# Patient Record
Sex: Female | Born: 1970 | Hispanic: Yes | Marital: Married | Smoking: Never smoker
Health system: Southern US, Community
[De-identification: ages and names within clinical notes are randomized; demographics above are authoritative.]

## PROBLEM LIST (undated history)

## (undated) DIAGNOSIS — K219 Gastro-esophageal reflux disease without esophagitis: Secondary | ICD-10-CM

## (undated) HISTORY — PX: TUBAL LIGATION: SHX77

---

## 2014-12-13 ENCOUNTER — Ambulatory Visit (INDEPENDENT_AMBULATORY_CARE_PROVIDER_SITE_OTHER): Payer: Self-pay | Admitting: Physician Assistant

## 2014-12-13 VITALS — BP 122/74 | HR 80 | Temp 98.3°F | Resp 18 | Ht 59.0 in | Wt 109.0 lb

## 2014-12-13 DIAGNOSIS — L500 Allergic urticaria: Secondary | ICD-10-CM

## 2014-12-13 MED ORDER — CETIRIZINE HCL 10 MG PO TABS: 10.0000 mg | ORAL_TABLET | Freq: Every day | ORAL | Status: DC

## 2014-12-13 MED ORDER — HYDROXYZINE HCL 10 MG PO TABS: 10.0000 mg | ORAL_TABLET | Freq: Three times a day (TID) | ORAL | Status: DC | PRN

## 2014-12-13 MED ORDER — RANITIDINE HCL 150 MG PO TABS: 150.0000 mg | ORAL_TABLET | Freq: Two times a day (BID) | ORAL | Status: DC

## 2014-12-13 NOTE — Progress Notes (Signed)
12/13/2014 at 10:47 AM  Adriana Simas / DOB: 1970-03-23 / MRN: [id number]  The patient  does not have a problem list on file.  SUBJECTIVE  Sabriya Yono is a 44 y.o. well appearing female presenting for the chief complaint of rash all over that started three days ago.  Reports large red itchy splotches that come and go, worse after showering and in the morning. Has tried nothing for her symptoms yet.  Denies any new foods and takes no medication and has not tried any new medication.  This is a new problem for her.  Recently moved to [place] from [place] in August.  Denies lip, throat and tongue swelling.     She  has no past medical history on file.    Medications reviewed and updated by myself where necessary, and exist elsewhere in the encounter.   Ms. Lucius Conn has No Known Allergies. She  reports that she has never smoked. She does not have any smokeless tobacco history on file. She reports that she does not drink alcohol or use illicit drugs. She  has no sexual activity history on file. The patient  has no past surgical history on file.  Her family history includes Diabetes in her mother.  Review of Systems  Constitutional: Negative for fever and chills.  Respiratory: Negative for shortness of breath.   Cardiovascular: Negative for leg swelling.  Gastrointestinal: Negative for nausea.  Genitourinary: Negative.   Skin: Positive for itching and rash.  Neurological: Negative for dizziness and headaches.    OBJECTIVE  Her  height is  (1.499 m) and weight is 109 lb (49.442 kg). Her oral temperature is 98.3 F (36.8 C). Her blood pressure is 122/74 and her pulse is 80. Her respiration is 18 and oxygen saturation is 99%.  The patient's body mass index is 22 kg/(m^2).  Physical Exam  Constitutional: She is oriented to person, place, and time. She appears well-developed and well-nourished. No distress.  HENT:  Head:    Mouth/Throat: Uvula is midline. Mucous membranes are not  cyanotic. No oropharyngeal exudate, posterior oropharyngeal edema or posterior oropharyngeal erythema.  Eyes: Pupils are equal, round, and reactive to light.  Cardiovascular: Normal rate.   Respiratory: Effort normal. No respiratory distress.  GI: She exhibits no distension.  Musculoskeletal: Normal range of motion.  Neurological: She is alert and oriented to person, place, and time.  Skin: Skin is warm and dry. Rash noted. Rash is urticarial. She is not diaphoretic.  Mild blanching macular rash of varying size and location.  Worse in the arm pits and popliteal spaces.  Negative for excoriation, vesicles and tenderness.    Psychiatric: She has a normal mood and affect. Her behavior is normal. Judgment and thought content normal.    No results found for this or any previous visit (from the past 24 hour(s)).  ASSESSMENT & PLAN  Yasuko was seen today for rash.  Diagnoses and all orders for this visit:  Allergic urticaria: Mild. Unknown etiology.   No alarm symptoms.  Will treat conservatively and give this more time.  Will consider prednisone and allergist referral as needed by the patient.  She was instructed to call if her symptoms change, worsen, and do not go away with time.  -     cetirizine (ZYRTEC) 10 MG tablet; Take 1 tablet (10 mg total) by mouth daily. -     hydrOXYzine (ATARAX/VISTARIL) 10 MG tablet; Take 1 tablet (10 mg total) by mouth 3 (three) times daily  as needed for itching. -     ranitidine (ZANTAC) 150 MG tablet; Take 1 tablet (150 mg total) by mouth 2 (two) times daily.   The patient was advised to call or come back to clinic if she does not see an improvement in symptoms, or worsens with the above plan.   Deliah Boston, MHS, PA-C [facility] 12/13/2014 10:47 AM

## 2014-12-13 NOTE — Patient Instructions (Addendum)
Take 1 zyrtec daily for the next five days.  Take one ranitidine in the morning and night for the next five days.  Take 1 hydroxyzine every eight hours as needed for itching.   Call me at [phone] if your symptoms worsen so that I may refer you to an allergist and consider other medications. Avoid heavy blankets and clothing, along with hot showers, as this may make your hives worse.

## 2017-11-23 ENCOUNTER — Emergency Department (HOSPITAL_COMMUNITY): Payer: Self-pay

## 2017-11-23 ENCOUNTER — Emergency Department (HOSPITAL_COMMUNITY)
Admission: EM | Admit: 2017-11-23 | Discharge: 2017-11-23 | Disposition: A | Discharge status: Home / Self Care | Payer: Self-pay | Attending: Emergency Medicine | Admitting: Emergency Medicine

## 2017-11-23 ENCOUNTER — Encounter (HOSPITAL_COMMUNITY): Payer: Self-pay

## 2017-11-23 ENCOUNTER — Other Ambulatory Visit: Payer: Self-pay

## 2017-11-23 DIAGNOSIS — Z79899 Other long term (current) drug therapy: Secondary | ICD-10-CM | POA: Insufficient documentation

## 2017-11-23 DIAGNOSIS — K8020 Calculus of gallbladder without cholecystitis without obstruction: Secondary | ICD-10-CM | POA: Insufficient documentation

## 2017-11-23 DIAGNOSIS — R10811 Right upper quadrant abdominal tenderness: Secondary | ICD-10-CM

## 2017-11-23 LAB — URINALYSIS, ROUTINE W REFLEX MICROSCOPIC
BACTERIA UA: NONE SEEN
BILIRUBIN URINE: NEGATIVE
Glucose, UA: NEGATIVE mg/dL
HGB URINE DIPSTICK: NEGATIVE
Ketones, ur: NEGATIVE mg/dL
Leukocytes, UA: NEGATIVE
NITRITE: NEGATIVE
PROTEIN: NEGATIVE mg/dL
SPECIFIC GRAVITY, URINE: 1.014 (ref 1.005–1.030)
pH: 6 (ref 5.0–8.0)

## 2017-11-23 LAB — COMPREHENSIVE METABOLIC PANEL
ALK PHOS: 59 U/L (ref 38–126)
ALT: 17 U/L (ref 0–44)
ANION GAP: 11 (ref 5–15)
AST: 22 U/L (ref 15–41)
Albumin: 3.8 g/dL (ref 3.5–5.0)
BUN: 13 mg/dL (ref 6–20)
CO2: 21 mmol/L — ABNORMAL LOW (ref 22–32)
Calcium: 8.9 mg/dL (ref 8.9–10.3)
Chloride: 106 mmol/L (ref 98–111)
Creatinine, Ser: 0.82 mg/dL (ref 0.44–1.00)
GFR calc non Af Amer: >60 mL/min (ref >60)
GLUCOSE: 129 mg/dL — AB (ref 70–99)
Potassium: 3.2 mmol/L — ABNORMAL LOW (ref 3.5–5.1)
Sodium: 138 mmol/L (ref 135–145)
Total Bilirubin: 0.7 mg/dL (ref 0.3–1.2)
Total Protein: 7.3 g/dL (ref 6.5–8.1)

## 2017-11-23 LAB — CBC
HCT: 39.2 % (ref 36.0–46.0)
HEMOGLOBIN: 13.2 g/dL (ref 12.0–15.0)
MCH: 31.1 pg (ref 26.0–34.0)
MCHC: 33.7 g/dL (ref 30.0–36.0)
MCV: 92.2 fL (ref 78.0–100.0)
Platelets: 231 10*3/uL (ref 150–400)
RBC: 4.25 MIL/uL (ref 3.87–5.11)
RDW: 13.9 % (ref 11.5–15.5)
WBC: 10.8 10*3/uL — ABNORMAL HIGH (ref 4.0–10.5)

## 2017-11-23 LAB — I-STAT BETA HCG BLOOD, ED (MC, WL, AP ONLY): I-stat hCG, quantitative: <5 m[IU]/mL (ref <5)

## 2017-11-23 LAB — LIPASE, BLOOD: Lipase: 37 U/L (ref 11–51)

## 2017-11-23 MED ORDER — ONDANSETRON 4 MG PO TBDP: 4.0000 mg | ORAL_TABLET | Freq: Three times a day (TID) | ORAL | 0 refills | Status: DC | PRN

## 2017-11-23 MED ORDER — ONDANSETRON HCL 4 MG PO TABS
4.0000 mg | ORAL_TABLET | Freq: Once | ORAL | Status: AC
  Administered 2017-11-23: 4 mg via ORAL
  Filled 2017-11-23: qty 1

## 2017-11-23 MED ORDER — SODIUM CHLORIDE 0.9 % IV BOLUS
1000.0000 mL | Freq: Once | INTRAVENOUS | Status: AC
  Administered 2017-11-23: 1000 mL via INTRAVENOUS

## 2017-11-23 MED ORDER — ONDANSETRON HCL 4 MG/2ML IJ SOLN
4.0000 mg | Freq: Once | INTRAMUSCULAR | Status: AC
  Administered 2017-11-23: 4 mg via INTRAVENOUS
  Filled 2017-11-23: qty 2

## 2017-11-23 MED ORDER — DICYCLOMINE HCL 20 MG PO TABS: 20.0000 mg | ORAL_TABLET | Freq: Two times a day (BID) | ORAL | 0 refills | Status: DC | PRN

## 2017-11-23 MED ORDER — KETOROLAC TROMETHAMINE 15 MG/ML IJ SOLN
15.0000 mg | Freq: Once | INTRAMUSCULAR | Status: AC
  Administered 2017-11-23: 15 mg via INTRAVENOUS
  Filled 2017-11-23: qty 1

## 2017-11-23 NOTE — ED Notes (Signed)
Saw PCP for abd pain x5 days ago - was given Toradol, bactrim and omeprazole - states has been taking these meds as directed with no relief; did not take meds this am as she was scheduled for abd US this am at 0800; however, did not have it - presented to ED instead

## 2017-11-23 NOTE — ED Notes (Signed)
Transported to radiology via stretcher per rad tech  

## 2017-11-23 NOTE — ED Triage Notes (Signed)
Pt here for abdominal pain that has gotten worse over the last week, seen PCP and taken medications for such with no improvement. Denies any burning with urination. A&Ox4

## 2017-11-23 NOTE — ED Notes (Signed)
Transported from radiology via stretcher per rad tech  

## 2017-11-23 NOTE — ED Provider Notes (Signed)
[facility] EMERGENCY DEPARTMENT Provider Note   CSN: [id number] Arrival date & time: 11/23/17  0003     History   Chief Complaint Chief Complaint  Patient presents with  . Abdominal Pain  . Back Pain    HPI April Zimmerman is a 47 y.o. female.  HPI   Presents with abdominal pain, began Friday, saw PCP, rx ketorolac and omeprazole, taking it since without improvement. Nagging pain, reports diffuse, radiates to back, reports associated nausea. No vomiting, no diarrhea, no urinary symptoms, no vaginal bleeding.  2 siblings with similar symptoms had cholecystectomies. No etoh/smoking.    No past medical history on file.  There are no active problems to display for this patient.    OB History   None      Home Medications    Prior to Admission medications   Medication Sig Start Date End Date Taking? Authorizing Provider  ketorolac (TORADOL) 10 MG tablet Take 10 mg by mouth every 6 (six) hours as needed for moderate pain.   Yes [provider]  omeprazole (PRILOSEC) 40 MG capsule Take 40 mg by mouth every morning.   Yes [provider]  sulfamethoxazole-trimethoprim (BACTRIM DS,SEPTRA DS) 800-160 MG tablet Take 1 tablet by mouth 2 (two) times daily.   Yes [provider]  dicyclomine (BENTYL) 20 MG tablet Take 1 tablet (20 mg total) by mouth 2 (two) times daily as needed for spasms (abdominal pain). 11/23/17   Alvira Monday, MD  ondansetron (ZOFRAN ODT) 4 MG disintegrating tablet Take 1 tablet (4 mg total) by mouth every 8 (eight) hours as needed for nausea or vomiting. 11/23/17   Alvira Monday, MD    Family History No family history on file.  Social History Social History   Tobacco Use  . Smoking status: Never Smoker  Substance Use Topics  . Alcohol use: Never    Frequency: Never  . Drug use: Never     Allergies   Patient has no known allergies.   Review of Systems Review of Systems  Constitutional:  Negative for fever.  HENT: Negative for sore throat.   Eyes: Negative for visual disturbance.  Respiratory: Negative for cough and shortness of breath.   Cardiovascular: Negative for chest pain.  Gastrointestinal: Positive for abdominal pain and nausea. Negative for constipation, diarrhea and vomiting.  Genitourinary: Negative for difficulty urinating, dysuria, vaginal bleeding and vaginal discharge.  Musculoskeletal: Positive for back pain. Negative for neck pain.  Skin: Negative for rash.  Neurological: Negative for syncope and headaches.     Physical Exam Updated Vital Signs BP 116/68 (BP Location: Right Arm)   Pulse 76   Temp 98.2 F (36.8 C) (Oral)   Resp 20   LMP  (LMP Unknown)   SpO2 100%   Physical Exam  Constitutional: She is oriented to person, place, and time. She appears well-developed and well-nourished. No distress.  HENT:  Head: Normocephalic and atraumatic.  Eyes: Conjunctivae and EOM are normal.  Neck: Normal range of motion.  Cardiovascular: Normal rate, regular rhythm, normal heart sounds and intact distal pulses. Exam reveals no gallop and no friction rub.  No murmur heard. Pulmonary/Chest: Effort normal and breath sounds normal. No respiratory distress. She has no wheezes. She has no rales.  Abdominal: Soft. She exhibits no distension. There is tenderness in the right upper quadrant. There is CVA tenderness (r). There is no guarding, no tenderness at McBurney's point and negative Murphy's sign.  Musculoskeletal: She exhibits no edema or tenderness.  Neurological: She is alert and oriented to person, place, and time.  Skin: Skin is warm and dry. No rash noted. She is not diaphoretic. No erythema.  Nursing note and vitals reviewed.    ED Treatments / Results  Labs (all labs ordered are listed, but only abnormal results are displayed) Labs Reviewed  COMPREHENSIVE METABOLIC PANEL - Abnormal; Notable for the following components:      Result Value    Potassium 3.2 (*)    CO2 21 (*)    Glucose, Bld 129 (*)    All other components within normal limits  CBC - Abnormal; Notable for the following components:   WBC 10.8 (*)    All other components within normal limits  LIPASE, BLOOD  URINALYSIS, ROUTINE W REFLEX MICROSCOPIC  I-STAT BETA HCG BLOOD, ED ([facility], [facility], [facility] ONLY)    EKG None  Radiology Ct Renal Stone Study  Result Date: 11/23/2017 CLINICAL DATA:  Flank pain, worse recently, no history of kidney stones EXAM: CT ABDOMEN AND PELVIS WITHOUT CONTRAST TECHNIQUE: Multidetector CT imaging of the abdomen and pelvis was performed following the standard protocol without IV contrast. COMPARISON:  None. FINDINGS: Lower chest: The lung bases are clear. The heart is within normal limits in size. No pericardial effusion is seen. Hepatobiliary: The liver is unremarkable in the unenhanced state. There is a small gallstone layering in the neck of the gallbladder. Pancreas: The pancreas is normal in size and the pancreatic duct is not dilated. Spleen: The spleen is unremarkable. Adrenals/Urinary Tract: The adrenal glands appear normal. No renal calculi are seen and there is no evidence of hydronephrosis. The ureters appear normal in caliber. The urinary bladder is not well distended but no abnormality is evident. Stomach/Bowel: The stomach is not well distended but no abnormality is evident. No abnormality of small bowel is seen. The colon is unremarkable. The terminal ileum appears normal. The appendix fills with air and is unremarkable. No inflammatory process is seen. Vascular/Lymphatic: The abdominal aorta is normal in caliber. No adenopathy is noted. Reproductive: The uterus is prominent, and fibroids cannot be excluded. No adnexal lesion is seen in no fluid is noted within the pelvis. Other: No abdominal wall hernia is evident. Musculoskeletal: The lumbar vertebrae are in normal alignment with normal intervertebral disc spaces. The SI joints appear  corticated. IMPRESSION: 1. No explanation for the patient's flank pain is seen. No renal or ureteral calculi are noted and there is no evidence of hydronephrosis. 2. The appendix and terminal ileum are unremarkable. 3. The uterus is somewhat prominent but no definite abnormality is noted. No fluid is noted within the pelvis. 4. Single small gallstone layers within the gallbladder. Electronically Signed   By: Dwyane Dee M.D.   On: 11/23/2017 11:05   US Abdomen Limited Ruq  Result Date: 11/23/2017 CLINICAL DATA:  Right upper quadrant pain for 5 days. EXAM: ULTRASOUND ABDOMEN LIMITED RIGHT UPPER QUADRANT COMPARISON:  Body CT 11/23/2017 FINDINGS: Gallbladder: 4 mm nonshadowing hyperechoic structure adherent to the dependent portion of the gallbladder wall. No evidence of gallbladder wall thickening. No sonographic Murphy sign noted by sonographer. Common bile duct: Diameter: 3.6 mm Liver: No focal lesion identified. Within normal limits in parenchymal echogenicity. Portal vein is patent on color Doppler imaging with normal direction of blood flow towards the liver. IMPRESSION: 4 mm nonshadowing gallbladder calculus versus polyp. No evidence of acute cholecystitis. Follow-up in 6-12 months may be considered. Electronically Signed   By: Ted Mcalpine M.D.   On: 11/23/2017  13:37    Procedures Procedures (including critical care time)  Medications Ordered in ED Medications  ondansetron (ZOFRAN) tablet 4 mg (4 mg Oral Given 11/23/17 0015)  ondansetron (ZOFRAN) injection 4 mg (4 mg Intravenous Given 11/23/17 0913)  ketorolac (TORADOL) 15 MG/ML injection 15 mg (15 mg Intravenous Given 11/23/17 0913)  sodium chloride 0.9 % bolus 1,000 mL (0 mLs Intravenous Stopped 11/23/17 1026)     Initial Impression / Assessment and Plan / ED Course  I have reviewed the triage vital signs and the nursing notes.  Pertinent labs & imaging results that were available during my care of the patient were reviewed by me and  considered in my medical decision making (see chart for details).     47yo female presents with concern for abdominal pain.  DDx includes cholelithiasis, pancreatitis, nephrolithiasis.  CT shows GB stone, no other acute abnormalities. RUQ US shows cholelithiasis but no cholecystitis. Pt improved in ED.  Discussed reasons to return in detail. Suspect symptomatic biliary colic and recommend outpatient General Surgery follow up> Given zofran/bentyl. Patient discharged in stable condition with understanding of reasons to return.   Final Clinical Impressions(s) / ED Diagnoses   Final diagnoses:  RUQ abdominal tenderness  Calculus of gallbladder without cholecystitis without obstruction    ED Discharge Orders         Ordered    ondansetron (ZOFRAN ODT) 4 MG disintegrating tablet  Every 8 hours PRN     11/23/17 1346    dicyclomine (BENTYL) 20 MG tablet  2 times daily PRN     11/23/17 1409           Alvira MondaySchlossman, Louie Meaders, MD 11/23/17 2031

## 2017-12-10 ENCOUNTER — Encounter (HOSPITAL_COMMUNITY): Payer: Self-pay | Admitting: *Deleted

## 2017-12-10 ENCOUNTER — Other Ambulatory Visit: Payer: Self-pay

## 2017-12-10 ENCOUNTER — Emergency Department (HOSPITAL_COMMUNITY)
Admission: EM | Admit: 2017-12-10 | Discharge: 2017-12-10 | Disposition: A | Discharge status: Home / Self Care | Payer: Self-pay | Attending: Emergency Medicine | Admitting: Emergency Medicine

## 2017-12-10 DIAGNOSIS — R110 Nausea: Secondary | ICD-10-CM | POA: Insufficient documentation

## 2017-12-10 DIAGNOSIS — K219 Gastro-esophageal reflux disease without esophagitis: Secondary | ICD-10-CM | POA: Insufficient documentation

## 2017-12-10 DIAGNOSIS — R1011 Right upper quadrant pain: Secondary | ICD-10-CM | POA: Insufficient documentation

## 2017-12-10 LAB — URINALYSIS, ROUTINE W REFLEX MICROSCOPIC
BILIRUBIN URINE: NEGATIVE
Glucose, UA: NEGATIVE mg/dL
Hgb urine dipstick: NEGATIVE
Ketones, ur: 20 mg/dL — AB
Leukocytes, UA: NEGATIVE
NITRITE: NEGATIVE
PH: 6 (ref 5.0–8.0)
Protein, ur: NEGATIVE mg/dL
SPECIFIC GRAVITY, URINE: 1.017 (ref 1.005–1.030)

## 2017-12-10 LAB — I-STAT BETA HCG BLOOD, ED (MC, WL, AP ONLY): I-stat hCG, quantitative: <5 m[IU]/mL (ref <5)

## 2017-12-10 LAB — COMPREHENSIVE METABOLIC PANEL
ALBUMIN: 3.9 g/dL (ref 3.5–5.0)
ALK PHOS: 58 U/L (ref 38–126)
ALT: 14 U/L (ref 0–44)
ANION GAP: 9 (ref 5–15)
AST: 18 U/L (ref 15–41)
BUN: 9 mg/dL (ref 6–20)
CO2: 23 mmol/L (ref 22–32)
Calcium: 9.1 mg/dL (ref 8.9–10.3)
Chloride: 107 mmol/L (ref 98–111)
Creatinine, Ser: 0.67 mg/dL (ref 0.44–1.00)
GFR calc Af Amer: >60 mL/min (ref >60)
GFR calc non Af Amer: >60 mL/min (ref >60)
GLUCOSE: 99 mg/dL (ref 70–99)
POTASSIUM: 4 mmol/L (ref 3.5–5.1)
Sodium: 139 mmol/L (ref 135–145)
Total Bilirubin: 0.8 mg/dL (ref 0.3–1.2)
Total Protein: 7 g/dL (ref 6.5–8.1)

## 2017-12-10 LAB — CBC
HEMATOCRIT: 40.3 % (ref 36.0–46.0)
HEMOGLOBIN: 13.2 g/dL (ref 12.0–15.0)
MCH: 30.6 pg (ref 26.0–34.0)
MCHC: 32.8 g/dL (ref 30.0–36.0)
MCV: 93.5 fL (ref 78.0–100.0)
Platelets: 173 10*3/uL (ref 150–400)
RBC: 4.31 MIL/uL (ref 3.87–5.11)
RDW: 13.3 % (ref 11.5–15.5)
WBC: 6.2 10*3/uL (ref 4.0–10.5)

## 2017-12-10 LAB — LIPASE, BLOOD: Lipase: 30 U/L (ref 11–51)

## 2017-12-10 MED ORDER — GI COCKTAIL ~~LOC~~
30.0000 mL | Freq: Once | ORAL | Status: AC
  Administered 2017-12-10: 30 mL via ORAL
  Filled 2017-12-10: qty 30

## 2017-12-10 MED ORDER — PANTOPRAZOLE SODIUM 40 MG PO TBEC
40.0000 mg | DELAYED_RELEASE_TABLET | Freq: Once | ORAL | Status: AC
  Administered 2017-12-10: 40 mg via ORAL
  Filled 2017-12-10: qty 1

## 2017-12-10 MED ORDER — PANTOPRAZOLE SODIUM 40 MG PO TBEC: 40.0000 mg | DELAYED_RELEASE_TABLET | Freq: Every day | ORAL | 1 refills | Status: DC

## 2017-12-10 MED ORDER — PROMETHAZINE HCL 25 MG PO TABS: 25.0000 mg | ORAL_TABLET | Freq: Four times a day (QID) | ORAL | 0 refills | Status: DC | PRN

## 2017-12-10 NOTE — Discharge Instructions (Signed)
You may use over-the-counter Mylanta, Maalox, Zantac, Tums as needed for symptoms of burning.

## 2017-12-10 NOTE — ED Triage Notes (Signed)
The pt is c/o a burning sensation in her abd for 2 weeks  She was seen here then and the pain is no better.  She reports thast she hass not slept for the past 3 nights

## 2017-12-10 NOTE — ED Provider Notes (Signed)
TIME SEEN: 4:39 AM  CHIEF COMPLAINT: Burning abdominal pain  HPI: Patient is a 47 year old female status post BTL who presents to the emergency department with weeks worth of burning abdominal pain.  States pain is in the epigastric region and right upper quadrant without radiation and worse at night and worse when she has an empty stomach.  She has had nausea but no vomiting or diarrhea.  No fever.  No bloody stools or melena.  Was diagnosed with cholelithiasis on CT scan and ultrasound on September 12 in the emergency department.  Followed up with Dr. Derrell Lolling with [facility] surgery who thought this may be more likely GI related and referred her to a gastroenterologist for endoscopy.  Gastroenterologist would not accept her as a new patient as she did not have insurance per her report.  Patient has been using Bentyl and Zofran at home without any relief.  Not on a PPI.  Spanish interpreter used.  ROS: See HPI Constitutional: no fever  Eyes: no drainage  ENT: no runny nose   Cardiovascular:  no chest pain  Resp: no SOB  GI: no vomiting GU: no dysuria Integumentary: no rash  Allergy: no hives  Musculoskeletal: no leg swelling  Neurological: no slurred speech ROS otherwise negative  PAST MEDICAL HISTORY/PAST SURGICAL HISTORY:  History reviewed. No pertinent past medical history.  MEDICATIONS:  Prior to Admission medications   Medication Sig Start Date End Date Taking? Authorizing Provider  cetirizine (ZYRTEC) 10 MG tablet Take 1 tablet (10 mg total) by mouth daily. 12/13/14   Ofilia Neas, PA-C  dicyclomine (BENTYL) 20 MG tablet Take 1 tablet (20 mg total) by mouth 2 (two) times daily as needed for spasms (abdominal pain). 11/23/17   Alvira Monday, MD  hydrOXYzine (ATARAX/VISTARIL) 10 MG tablet Take 1 tablet (10 mg total) by mouth 3 (three) times daily as needed for itching. 12/13/14   Ofilia Neas, PA-C  ketorolac (TORADOL) 10 MG tablet Take 10 mg by mouth every 6 (six)  hours as needed for moderate pain.    [provider]  omeprazole (PRILOSEC) 40 MG capsule Take 40 mg by mouth every morning.    [provider]  ondansetron (ZOFRAN ODT) 4 MG disintegrating tablet Take 1 tablet (4 mg total) by mouth every 8 (eight) hours as needed for nausea or vomiting. 11/23/17   Alvira Monday, MD  ranitidine (ZANTAC) 150 MG tablet Take 1 tablet (150 mg total) by mouth 2 (two) times daily. 12/13/14   Ofilia Neas, PA-C  sulfamethoxazole-trimethoprim (BACTRIM DS,SEPTRA DS) 800-160 MG tablet Take 1 tablet by mouth 2 (two) times daily.    [provider]    ALLERGIES:  No Known Allergies  SOCIAL HISTORY:  Social History   Tobacco Use  . Smoking status: Never Smoker  . Smokeless tobacco: Never Used  Substance Use Topics  . Alcohol use: Never    Frequency: Never    FAMILY HISTORY: Family History  Problem Relation Age of Onset  . Diabetes Mother     EXAM: BP 133/84 (BP Location: Right Arm)   Pulse 77   Temp 98.5 F (36.9 C) (Oral)   Resp 14   Ht 4\' 8"  (1.422 m)   Wt 49.9 kg   LMP  (LMP Unknown)   SpO2 99%   BMI 24.66 kg/m  CONSTITUTIONAL: Alert and oriented and responds appropriately to questions.  Tearful HEAD: Normocephalic EYES: Conjunctivae clear, pupils appear equal, EOMI ENT: normal nose; moist mucous membranes NECK: Supple,  no meningismus, no nuchal rigidity, no LAD  CARD: RRR; S1 and S2 appreciated; no murmurs, no clicks, no rubs, no gallops RESP: Normal chest excursion without splinting or tachypnea; breath sounds clear and equal bilaterally; no wheezes, no rhonchi, no rales, no hypoxia or respiratory distress, speaking full sentences ABD/GI: Normal bowel sounds; non-distended; soft, tender in the epigastric region and right upper quadrant with negative Murphy sign, no rebound, no guarding, no peritoneal signs, no hepatosplenomegaly BACK:  The back appears normal and is non-tender to palpation, there is no CVA  tenderness EXT: Normal ROM in all joints; non-tender to palpation; no edema; normal capillary refill; no cyanosis, no calf tenderness or swelling    SKIN: Normal color for age and race; warm; no rash NEURO: Moves all extremities equally PSYCH: The patient's mood and manner are appropriate. Grooming and personal hygiene are appropriate.  MEDICAL DECISION MAKING: Patient here with symptoms more likely of gastritis versus peptic ulcer versus GERD.  Does have history of cholelithiasis but picture is not consistent with biliary colic.  Labs obtained are unremarkable including no leukocytosis, normal LFTs and lipase.  She is not on a PPI.  Will give GI cocktail, Protonix and reassess.  Patient is upset she would like to be admitted to the hospital for an endoscopy today.  No emergent reason for endoscopy at this time.  She is not bleeding, vomiting, no peritoneal signs.  ED PROGRESS: Patient's pain completely resolved after GI cocktail and PPI.  I recommended she start a PPI regularly and use over-the-counter Mylanta as needed.  Will also discharge with prescription of viscous lidocaine to use as needed.  Recommended diet changes and GI follow-up as an outpatient.   At this time, I do not feel there is any life-threatening condition present. I have reviewed and discussed all results (EKG, imaging, lab, urine as appropriate) and exam findings with patient/family. I have reviewed nursing notes and appropriate previous records.  I feel the patient is safe to be discharged home without further emergent workup and can continue workup as an outpatient as needed. Discussed usual and customary return precautions. Patient/family verbalize understanding and are comfortable with this plan.  Outpatient follow-up has been provided if needed. All questions have been answered.      Orlando Devereux, Layla Maw, DO 12/10/17 [phone]

## 2017-12-12 ENCOUNTER — Ambulatory Visit: Payer: Self-pay | Attending: Family Medicine | Admitting: Family Medicine

## 2017-12-12 ENCOUNTER — Other Ambulatory Visit: Payer: Self-pay

## 2017-12-12 VITALS — BP 115/76 | HR 77 | Temp 98.2°F | Resp 17 | Wt 115.2 lb

## 2017-12-12 DIAGNOSIS — K219 Gastro-esophageal reflux disease without esophagitis: Secondary | ICD-10-CM

## 2017-12-12 DIAGNOSIS — Z79899 Other long term (current) drug therapy: Secondary | ICD-10-CM | POA: Insufficient documentation

## 2017-12-12 DIAGNOSIS — Z09 Encounter for follow-up examination after completed treatment for conditions other than malignant neoplasm: Secondary | ICD-10-CM | POA: Insufficient documentation

## 2017-12-12 DIAGNOSIS — R079 Chest pain, unspecified: Secondary | ICD-10-CM

## 2017-12-12 DIAGNOSIS — Z1239 Encounter for other screening for malignant neoplasm of breast: Secondary | ICD-10-CM

## 2017-12-12 NOTE — Patient Instructions (Addendum)
If you prefer, our new [place] office will open 12/18/2017, you may contact our office on Monday to schedule   Schedule an appointment for a complete physical exam with gynecological visit.  Complete financial assistance paperwork and schedule an appointment with our financial assistance counselor.  Opciones de alimentos para pacientes con reflujo gastroesofgico - Adultos (Food Choices for Gastroesophageal Reflux Disease, Adult) Cuando se tiene reflujo gastroesofgico (ERGE), los alimentos que se ingieren y los hbitos de alimentacin son muy importantes. Elegir los alimentos adecuados puede ayudar a [employer]. QU PAUTAS DEBO SEGUIR?  Elija las frutas, los vegetales, los cereales integrales y los productos lcteos con bajo contenido de [place].  Elija las carnes de [place], de pescado y de ave con bajo contenido de grasas.  Limite las grasas, [address] [place], los aderezos para [place], la [place], los frutos secos y Programme researcher, broadcasting/film/video.  Lleve un registro de alimentos. Esto ayuda a identificar los alimentos que ocasionan sntomas.  Evite los alimentos que le ocasionen sntomas. Pueden ser distintos para cada persona.  Haga comidas pequeas durante Glass blower/designer de 3 comidas abundantes.  Coma lentamente, en un lugar donde est distendido.  Limite el consumo de alimentos fritos.  Cocine los alimentos utilizando mtodos que no sean la fritura.  Evite el consumo alcohol.  Evite beber grandes cantidades de lquidos con las comidas.  Evite agacharse o recostarse hasta despus de 2 o 3horas de haber comido.  QU ALIMENTOS NO SE RECOMIENDAN? Estos son algunos alimentos y bebidas que pueden empeorar los sntomas: Veterinary surgeon. Jugo de tomate. Salsa de tomate y espagueti. Ajes. Cebolla y [place]. Rbano picante. Frutas Naranjas, pomelos y limn (fruta y [place]). Carnes Carnes de [place], de pescado y de ave con gran contenido de grasas. Esto incluye los perros calientes, las  [place], el [place], la salchicha, el salame y el tocino. Lcteos L[place]he entera y [place]. [employer]. Crema. [place]. Helados. Queso crema. Bebidas T o caf. Bebidas gaseosas o bebidas energizantes. Condimentos Salsa picante. Salsa barbacoa. Dulces/postres Chocolate y cacao. [place]. Menta y mentol. Grasas y [employer]. Esto incluye las papas fritas. Otros [place]. Especias picantes. Esto incluye la pimienta negra, la pimienta blanca, la pimienta roja, la pimienta de cayena, el curry en polvo, los clavos de [place], el jengibre y el [place] en polvo. Esta no es [employer] de los alimentos y las bebidas que se Theatre stage manager. Comunquese con el nutricionista para recibir ms informacin. Esta informacin no tiene Theme park manager el consejo del mdico. Asegrese de hacerle al mdico cualquier pregunta que tenga. Document Released: 08/30/2011 Document Revised: 03/21/2014 Document Reviewed: 01/02/2013 Elsevier Interactive Patient Education  2017 [employer].

## 2017-12-12 NOTE — Progress Notes (Signed)
Renetta Suman, is a 47 y.o. female  ZOX:[id number]  [id number]:[id number]  DOB - 1970-04-05  CC:  Chief Complaint  Patient presents with  . Follow-up    originally seen at ED on 9/12. had c/o of RUQ abdominal pain. was referred to GI but couldn't be seen due to lack of insurance. went back to the ED on 12/10/17 & started on Protonix & Phenergan. states that her GERD is better controlled with Protonix.       Chronic health problems include:has Gastroesophageal reflux disease on their problem list.   HPI: Ziare is a 47 y.o. female is here today to establish care. No recent primary care.  Patient has presented to the emergency department twice with abdominal pain.  Initially on 11/23/2017 she was advised that she had gallstones and was referred to general surgery for evaluation.  She represented to the ER on 12/10/2017 and was treated with a PPI and reports today that symptoms have resolved.  She has obtained an appointment with [facility] for second opinion on 12/18/2017.  She reports initially experiencing chest pressure and a burning sensation.  She has associated symptoms with eating high fat and very spicy foods.  Since taking the omeprazole symptoms have resolved completely.  No prior history of work-up for acid reflux.  Patient denies new headaches, chest pain, abdominal pain, nausea, new weakness , numbness or tingling, SOB, edema, or worrisome cough. .   Current medications: Current Outpatient Medications:  .  pantoprazole (PROTONIX) 40 MG tablet, Take 1 tablet (40 mg total) by mouth daily., Disp: 30 tablet, Rfl: 1 .  promethazine (PHENERGAN) 25 MG tablet, Take 1 tablet (25 mg total) by mouth every 6 (six) hours as needed for nausea or vomiting. (Patient not taking: Reported on 12/12/2017), Disp: 15 tablet, Rfl: 0   Pertinent family medical history: family history includes Diabetes in her mother.   No Known Allergies  Social History   Socioeconomic History  . Marital status:  Married    Spouse name: Not on file  . Number of children: Not on file  . Years of education: Not on file  . Highest education level: Not on file  Occupational History  . Not on file  Social Needs  . Financial resource strain: Not on file  . Food insecurity:    Worry: Not on file    Inability: Not on file  . Transportation needs:    Medical: Not on file    Non-medical: Not on file  Tobacco Use  . Smoking status: Never Smoker  . Smokeless tobacco: Never Used  Substance and Sexual Activity  . Alcohol use: Never    Frequency: Never  . Drug use: Never  . Sexual activity: Not on file  Lifestyle  . Physical activity:    Days per week: Not on file    Minutes per session: Not on file  . Stress: Not on file  Relationships  . Social connections:    Talks on phone: Not on file    Gets together: Not on file    Attends religious service: Not on file    Active member of club or organization: Not on file    Attends meetings of clubs or organizations: Not on file    Relationship status: Not on file  . Intimate partner violence:    Fear of current or ex partner: Not on file    Emotionally abused: Not on file    Physically abused: Not on file    Forced  sexual activity: Not on file  Other Topics Concern  . Not on file  Social History Narrative   ** Merged History Encounter **        Review of Systems: Constitutional: Negative for fever, chills, diaphoresis, activity change, appetite change and fatigue. HENT: Negative for ear pain, nosebleeds, congestion, facial swelling, rhinorrhea, neck pain, neck stiffness and ear discharge.  Respiratory: Negative for cough, choking, chest tightness, shortness of breath, wheezing and stridor.  Cardiovascular: Negative for chest pain, palpitations and leg swelling. Gastrointestinal: Negative for abdominal distention. Neurological: Negative for dizziness, tremors, seizures, syncope, facial asymmetry, speech difficulty, weakness, light-headedness,  numbness and headaches.  Hematological: Negative for adenopathy. Does not bruise/bleed easily. Psychiatric/Behavioral: Negative for hallucinations, behavioral problems, confusion, dysphoric mood, decreased concentration and agitation.    Objective:   Vitals:   12/12/17 1058  BP: 115/76  Pulse: 77  Resp: 17  Temp: 98.2 F (36.8 C)  SpO2: 96%    Physical Exam: Constitutional: Patient appears well-developed and well-nourished. No distress. HENT: Normocephalic, atraumatic, External right and left ear normal. Oropharynx is clear and moist.  Eyes: Conjunctivae and EOM are normal. PERRLA, no scleral icterus. Neck: Normal ROM. Neck supple. No JVD. No tracheal deviation. No thyromegaly. CVS: RRR, S1/S2 +, no murmurs, no gallops, no carotid bruit.  Pulmonary: Effort and breath sounds normal, no stridor, rhonchi, wheezes, rales.  Abdominal: Soft. BS +, no distension, tenderness, rebound or guarding.  Musculoskeletal: Normal range of motion. No edema and no tenderness.  Neuro: Alert. Normal reflexes, muscle tone coordination. No cranial nerve deficit. Skin: Skin is warm and dry. No rash noted. Not diaphoretic. No erythema. No pallor. Psychiatric: Normal mood and affect. Behavior, judgment, thought content normal.  Lab Results  Component Value Date   WBC 6.2 12/10/2017   HGB 13.2 12/10/2017   HCT 40.3 12/10/2017   MCV 93.5 12/10/2017   PLT 173 12/10/2017   Lab Results  Component Value Date   CREATININE 0.67 12/10/2017   BUN 9 12/10/2017   NA 139 12/10/2017   K 4.0 12/10/2017   CL 107 12/10/2017   CO2 23 12/10/2017    No results found for: HGBA1C  Lipid Panel  No results found for: CHOL, TRIG, HDL, CHOLHDL, VLDL, LDLCALC     Assessment and plan:  1. Gastroesophageal reflux disease, esophagitis presence not specified, controlled with current therapy. Continue Protonix. No refills on phenergan as patient is negative for nausea.   2. Chest pain, unspecified type, resolve.  Obtained a EKG to rule out cardiac cause of chest pressure and burning.  EKG 12-Lead NSR without ischemic changes.  3. Encounter for screening breast examination - MM Digital Screening; Future   Return in about 2 months (around 02/11/2018) for Complete physical with PAP .  The patient was given clear instructions to go to ER or return to medical center if symptoms don't improve, worsen or new problems develop. The patient verbalized understanding. The patient was told to call to get lab results if they haven't heard anything in the next week.    Godfrey Pick. Tiburcio Pea, MSN, [facility]  [address] [place], [place] [address] [phone]    This note has been created with Dragon speech recognition software and smart phrase technology. Any transcriptional errors are unintentional.

## 2017-12-27 ENCOUNTER — Ambulatory Visit: Payer: Self-pay | Attending: Family Medicine

## 2018-01-08 ENCOUNTER — Other Ambulatory Visit (HOSPITAL_COMMUNITY): Payer: Self-pay | Admitting: *Deleted

## 2018-01-08 DIAGNOSIS — Z1231 Encounter for screening mammogram for malignant neoplasm of breast: Secondary | ICD-10-CM

## 2018-02-02 MED FILL — SUCRALFATE 1 GM TABLET: 1 | 30 days supply | Qty: 60 | Fill #0

## 2018-02-02 MED FILL — DICYCLOMINE HCL 20 MG TABS: 20 | 30 days supply | Qty: 120 | Fill #0

## 2018-02-12 ENCOUNTER — Encounter: Payer: Self-pay | Admitting: Family Medicine

## 2018-02-12 ENCOUNTER — Other Ambulatory Visit (HOSPITAL_COMMUNITY)
Admission: RE | Admit: 2018-02-12 | Discharge: 2018-02-12 | Disposition: A | Discharge status: Home / Self Care | Payer: Self-pay | Source: Ambulatory Visit | Attending: Family Medicine | Admitting: Family Medicine

## 2018-02-12 ENCOUNTER — Ambulatory Visit (INDEPENDENT_AMBULATORY_CARE_PROVIDER_SITE_OTHER): Payer: Self-pay | Admitting: Family Medicine

## 2018-02-12 VITALS — BP 120/81 | HR 79 | Resp 17 | Ht 61.0 in | Wt 113.0 lb

## 2018-02-12 DIAGNOSIS — Z13220 Encounter for screening for lipoid disorders: Secondary | ICD-10-CM

## 2018-02-12 DIAGNOSIS — Z1329 Encounter for screening for other suspected endocrine disorder: Secondary | ICD-10-CM

## 2018-02-12 DIAGNOSIS — Z131 Encounter for screening for diabetes mellitus: Secondary | ICD-10-CM

## 2018-02-12 DIAGNOSIS — Z0000 Encounter for general adult medical examination without abnormal findings: Secondary | ICD-10-CM

## 2018-02-12 DIAGNOSIS — Z124 Encounter for screening for malignant neoplasm of cervix: Secondary | ICD-10-CM | POA: Insufficient documentation

## 2018-02-12 DIAGNOSIS — Z23 Encounter for immunization: Secondary | ICD-10-CM

## 2018-02-12 MED ORDER — TRIAMCINOLONE ACETONIDE 0.1 % EX CREA: 1.0000 "application " | TOPICAL_CREAM | Freq: Two times a day (BID) | CUTANEOUS | 0 refills | Status: DC | PRN

## 2018-02-12 MED FILL — TRIAMCINOLONE ACETONIDE 0.1: 0.1 | 30 days supply | Qty: 90 | Fill #0

## 2018-02-12 NOTE — Progress Notes (Signed)
Established Patient Office Visit  Subjective:  Patient ID: April Zimmerman, female    DOB: 10-28-1970  Age: 47 y.o. MRN: [id number]  CC:  Chief Complaint  Patient presents with  . Annual Exam  . Gynecologic Exam    HPI April Zimmerman presents for complete physical exam. Chronic problems include GERD, currently managed by [facility] physicians. No history of cardiovascular disease, cancer,or or lung disease. She is a non-smoker and non-drinker. She is currently perimenopausal and reports that she continues to have periods however they are steadily decreasing in length.  Current menstrual periods last approximately 1.5 days.  She is a mother of 7 children with the oldest child  age 37 and the youngest 31 years old.  She denies a history of abnormal Pap.  She has a routine mammogram scheduled for April 05, 2018.  No known family history of breast cancer.  She denies any other complaints today. Past Surgical History:  Procedure Laterality Date  . Tubal ligation x 9 years ago       Family History  Problem Relation Age of Onset  . Diabetes Mother     Social History   Socioeconomic History  . Marital status: Married    Spouse name: Not on file  . Number of children: Not on file  . Years of education: Not on file  . Highest education level: Not on file  Occupational History  . Not on file  Social Needs  . Financial resource strain: Not on file  . Food insecurity:    Worry: Not on file    Inability: Not on file  . Transportation needs:    Medical: Not on file    Non-medical: Not on file  Tobacco Use  . Smoking status: Never Smoker  . Smokeless tobacco: Never Used  Substance and Sexual Activity  . Alcohol use: Never    Frequency: Never  . Drug use: Never  . Sexual activity: Not on file  Lifestyle  . Physical activity:    Days per week: Not on file    Minutes per session: Not on file  . Stress: Not on file  Relationships  . Social connections:    Talks on phone: Not on  file    Gets together: Not on file    Attends religious service: Not on file    Active member of club or organization: Not on file    Attends meetings of clubs or organizations: Not on file    Relationship status: Not on file  . Intimate partner violence:    Fear of current or ex partner: Not on file    Emotionally abused: Not on file    Physically abused: Not on file    Forced sexual activity: Not on file  Other Topics Concern  . Not on file  Social History Narrative   ** Merged History Encounter **        Outpatient Medications Prior to Visit  Medication Sig Dispense Refill  . dicyclomine (BENTYL) 20 MG tablet Take 1 tablet by mouth 4 (four) times daily as needed.  1  . sucralfate (CARAFATE) 1 g tablet Take 1 tablet by mouth 2 (two) times daily.  1  . pantoprazole (PROTONIX) 40 MG tablet Take 1 tablet (40 mg total) by mouth daily. 30 tablet 1  . promethazine (PHENERGAN) 25 MG tablet Take 1 tablet (25 mg total) by mouth every 6 (six) hours as needed for nausea or vomiting. (Patient not taking: Reported on 12/12/2017) 15 tablet 0  No facility-administered medications prior to visit.     No Known Allergies  ROS Review of Systems Denies chest pain, headaches, new weakness, worsening cough, abdominal pain, edema, urinary retention, urinary frequency, wheezing,chest tightness,depression, suicidal ideations or auditory hallucinations.   Objective:    Physical Exam  BP 120/81   Pulse 79   Resp 17   Ht 5\' 1"  (1.549 m)   Wt 113 lb (51.3 kg)   LMP 02/03/2018 (Approximate)   SpO2 98%   BMI 21.35 kg/m  Wt Readings from Last 3 Encounters:  02/12/18 113 lb (51.3 kg)  12/12/17 115 lb 3.2 oz (52.3 kg)  12/10/17 110 lb 0.2 oz (49.9 kg)    Constitutional: Patient appears well-developed and well-nourished. No distress. HENT: Normocephalic, atraumatic, External right and left ear normal. Oropharynx is clear and moist.  Eyes: Conjunctivae and EOM are normal. PERRLA, no scleral  icterus. Neck: Normal ROM. Neck supple. No JVD. No tracheal deviation. No thyromegaly. CVS: RRR, S1/S2 +, no murmurs, no gallops, no carotid bruit.  Pulmonary: Effort and breath sounds normal, no stridor, rhonchi, wheezes, rales.  Abdominal: Soft. BS +, no distension, tenderness, rebound or guarding.  Genitourinary:Normal female external genitalia without lesion. No inguinal lymphadenopathy. Vaginal mucosa is pink and moist without lesions. Cervix is closed without discharge, not friable. Pap smear obtained. No cervical motion tenderness, adnexal fullness or tenderness. Musculoskeletal: Normal range of motion. No edema and no tenderness.  Lymphadenopathy: No lymphadenopathy noted, cervical, inguinal or axillary Neuro: Alert. Normal reflexes, muscle tone coordination. No cranial nerve deficit. Skin: Skin is warm and dry. No rash noted. Not diaphoretic. No erythema. No pallor. Psychiatric: Normal mood and affect. Behavior, judgment, thought content normal.  Health Maintenance Due  Topic Date Due  . HIV Screening  03/25/1985  . TETANUS/TDAP  03/25/1989  . PAP SMEAR  03/26/1991    There are no preventive care reminders to display for this patient.  No results found for: TSH Lab Results  Component Value Date   WBC 6.2 12/10/2017   HGB 13.2 12/10/2017   HCT 40.3 12/10/2017   MCV 93.5 12/10/2017   PLT 173 12/10/2017   Lab Results  Component Value Date   NA 139 12/10/2017   K 4.0 12/10/2017   CO2 23 12/10/2017   GLUCOSE 99 12/10/2017   BUN 9 12/10/2017   CREATININE 0.67 12/10/2017   BILITOT 0.8 12/10/2017   ALKPHOS 58 12/10/2017   AST 18 12/10/2017   ALT 14 12/10/2017   PROT 7.0 12/10/2017   ALBUMIN 3.9 12/10/2017   CALCIUM 9.1 12/10/2017   ANIONGAP 9 12/10/2017    Assessment & Plan:   Problem List Items Addressed This Visit    None    Visit Diagnoses    Annual physical exam    -  Primary   Cervical cancer screening         1. Annual physical exam Age-appropriate  anticipatory guidance  - CBC with Differential - EKG 12-Lead, NSR no evidence of ischemia or ST changes   2. Cervical cancer screening - Cytology - PAP(Declo)  3. Screening for diabetes mellitus - Hemoglobin A1c; Future - POCT URINALYSIS DIP (CLINITEK)  4. Screening, lipid - Lipid panel; Future  5. Screening for thyroid disorder - Thyroid Panel With TSH  Meds ordered this encounter  Medications  . triamcinolone cream (KENALOG) 0.1 %    Sig: Apply 1 application topically 2 (two) times daily as needed.    Dispense:  90 g    Refill:  0    Follow-up: as needed.  Joaquin Courts, FNP  Primary Care at [facility] [address] [place] [address] [phone]: [phone]

## 2018-02-13 ENCOUNTER — Other Ambulatory Visit: Payer: Self-pay

## 2018-02-13 DIAGNOSIS — Z131 Encounter for screening for diabetes mellitus: Secondary | ICD-10-CM

## 2018-02-13 DIAGNOSIS — Z0000 Encounter for general adult medical examination without abnormal findings: Secondary | ICD-10-CM

## 2018-02-13 LAB — POCT URINALYSIS DIP (CLINITEK)
BILIRUBIN UA: NEGATIVE
GLUCOSE UA: NEGATIVE mg/dL
Ketones, POC UA: NEGATIVE mg/dL
Leukocytes, UA: NEGATIVE
NITRITE UA: NEGATIVE
POC PROTEIN,UA: NEGATIVE
Spec Grav, UA: >=1.03 — AB (ref 1.010–1.025)
Urobilinogen, UA: 0.2 E.U./dL
pH, UA: 5.5 (ref 5.0–8.0)

## 2018-02-13 NOTE — Addendum Note (Signed)
Addended by: Heidi DachWALKER, Tarisha Fader M on: 02/13/2018 09:01 AM   Modules accepted: Orders

## 2018-02-14 LAB — THYROID PANEL WITH TSH
FREE THYROXINE INDEX: 2.7 (ref 1.2–4.9)
T3 Uptake Ratio: 27 % (ref 24–39)
T4 TOTAL: 9.9 ug/dL (ref 4.5–12.0)
TSH: 1.01 u[IU]/mL (ref 0.450–4.500)

## 2018-02-14 LAB — CBC WITH DIFFERENTIAL/PLATELET
BASOS ABS: 0 10*3/uL (ref 0.0–0.2)
Basos: 0 %
EOS (ABSOLUTE): 0 10*3/uL (ref 0.0–0.4)
Eos: 1 %
HEMATOCRIT: 36.3 % (ref 34.0–46.6)
Hemoglobin: 11.9 g/dL (ref 11.1–15.9)
IMMATURE GRANS (ABS): 0 10*3/uL (ref 0.0–0.1)
Immature Granulocytes: 0 %
LYMPHS ABS: 1.4 10*3/uL (ref 0.7–3.1)
Lymphs: 25 %
MCH: 30.2 pg (ref 26.6–33.0)
MCHC: 32.8 g/dL (ref 31.5–35.7)
MCV: 92 fL (ref 79–97)
MONOCYTES: 7 %
MONOS ABS: 0.4 10*3/uL (ref 0.1–0.9)
NEUTROS ABS: 4 10*3/uL (ref 1.4–7.0)
Neutrophils: 67 %
Platelets: 217 10*3/uL (ref 150–450)
RBC: 3.94 x10E6/uL (ref 3.77–5.28)
RDW: 13.2 % (ref 12.3–15.4)
WBC: 5.9 10*3/uL (ref 3.4–10.8)

## 2018-02-14 LAB — LIPID PANEL
CHOL/HDL RATIO: 3.1 ratio (ref 0.0–4.4)
Cholesterol, Total: 178 mg/dL (ref 100–199)
HDL: 58 mg/dL (ref >39)
LDL Calculated: 103 mg/dL — ABNORMAL HIGH (ref 0–99)
TRIGLYCERIDES: 83 mg/dL (ref 0–149)
VLDL Cholesterol Cal: 17 mg/dL (ref 5–40)

## 2018-02-14 LAB — CYTOLOGY - PAP
Candida vaginitis: NEGATIVE
Chlamydia: NEGATIVE
DIAGNOSIS: NEGATIVE
HPV (WINDOPATH): NOT DETECTED
NEISSERIA GONORRHEA: NEGATIVE
TRICH (WINDOWPATH): NEGATIVE

## 2018-02-14 LAB — HEMOGLOBIN A1C
ESTIMATED AVERAGE GLUCOSE: 120 mg/dL
HEMOGLOBIN A1C: 5.8 % — AB (ref 4.8–5.6)

## 2018-02-16 ENCOUNTER — Encounter: Payer: Self-pay | Admitting: Family Medicine

## 2018-02-23 MED FILL — TRIAMCINOLONE ACETONIDE 0.1: 0.1 | 30 days supply | Qty: 90 | Fill #0

## 2018-02-28 ENCOUNTER — Ambulatory Visit: Payer: Self-pay | Attending: Family Medicine

## 2018-04-05 ENCOUNTER — Encounter (HOSPITAL_COMMUNITY): Payer: Self-pay | Admitting: *Deleted

## 2018-04-05 ENCOUNTER — Ambulatory Visit (HOSPITAL_COMMUNITY)
Admission: RE | Admit: 2018-04-05 | Discharge: 2018-04-05 | Disposition: A | Discharge status: Home / Self Care | Payer: Self-pay | Source: Ambulatory Visit | Attending: Obstetrics and Gynecology | Admitting: Obstetrics and Gynecology

## 2018-04-05 ENCOUNTER — Ambulatory Visit
Admission: RE | Admit: 2018-04-05 | Discharge: 2018-04-05 | Disposition: A | Discharge status: Home / Self Care | Payer: No Typology Code available for payment source | Source: Ambulatory Visit | Attending: Obstetrics and Gynecology | Admitting: Obstetrics and Gynecology

## 2018-04-05 ENCOUNTER — Encounter (HOSPITAL_COMMUNITY): Payer: Self-pay

## 2018-04-05 VITALS — BP 108/70 | Wt 115.0 lb

## 2018-04-05 DIAGNOSIS — Z1239 Encounter for other screening for malignant neoplasm of breast: Secondary | ICD-10-CM

## 2018-04-05 DIAGNOSIS — Z1231 Encounter for screening mammogram for malignant neoplasm of breast: Secondary | ICD-10-CM

## 2018-04-05 HISTORY — DX: Gastro-esophageal reflux disease without esophagitis: K21.9

## 2018-04-05 NOTE — Progress Notes (Signed)
No complaints today.   Pap Smear: Pap smear not completed today. Last Pap smear was 612/04/2017 at Primary Care at [facility] and normal with negative HPV. Per patient has no history of an abnormal Pap smear. Last Pap smear result is in Epic.  Physical exam: Breasts Breasts symmetrical. No skin abnormalities bilateral breasts. No nipple retraction bilateral breasts. No nipple discharge bilateral breasts. No lymphadenopathy. No lumps palpated bilateral breasts. No complaints of pain or tenderness on exam. Referred patient to the [facility] of [facility] for a screening mammogram. Appointment scheduled for Thursday, April 05, 2018 at 1240.       Pelvic/Bimanual No Pap smear completed today since last Pap smear and HPV typing was 02/12/2018. Pap smear not indicated per BCCCP guidelines.   Smoking History: Patient has never smoked.  Patient Navigation: Patient education provided. Access to services provided for patient through [facility] program. Spanish interpreter provided.   Breast and Cervical Cancer Risk Assessment: Patient has no family history of breast cancer, known genetic mutations, or radiation treatment to the chest before age 78. Patient has no history of cervical dysplasia, immunocompromised, or DES exposure in-utero.  Risk Assessment    Risk Scores      04/05/2018   Last edited by: Lynnell Dike, LPN   5-year risk: 0.5 %   Lifetime risk: 4.7 %         Used Spanish interpreter [name] from [place].

## 2018-04-05 NOTE — Patient Instructions (Signed)
Explained breast self awareness with April Zimmerman. Patient did not need a Pap smear today due to last Pap smear and HPV typing was 02/12/2018. Let her know BCCCP will cover Pap smears and HPV typing every 5 years unless has a history of abnormal Pap smears. Referred patient to the [facility] Center of [facility] for a screening mammogram. Appointment scheduled for Thursday, April 05, 2018 at 1240. Patient aware of appointment and will be there. Let patient know the Breast Center will follow up with her within the next couple weeks with results of mammogram by letter or phone. April Zimmerman verbalized understanding.  Srija Southard, Kathaleen Maser, RN 12:31 PM

## 2018-09-13 ENCOUNTER — Telehealth: Payer: Self-pay | Admitting: Family Medicine

## 2018-09-13 NOTE — Telephone Encounter (Signed)
Pt was called since received only the OC application and some documents, she was informed that we are sending the CAFA application also informer her what she need to bring and call for appt

## 2019-02-27 ENCOUNTER — Other Ambulatory Visit (HOSPITAL_COMMUNITY): Payer: Self-pay | Admitting: *Deleted

## 2019-02-27 DIAGNOSIS — Z1231 Encounter for screening mammogram for malignant neoplasm of breast: Secondary | ICD-10-CM

## 2019-04-16 ENCOUNTER — Encounter (HOSPITAL_COMMUNITY): Payer: Self-pay

## 2019-04-16 ENCOUNTER — Other Ambulatory Visit: Payer: Self-pay

## 2019-04-16 ENCOUNTER — Ambulatory Visit
Admission: RE | Admit: 2019-04-16 | Discharge: 2019-04-16 | Disposition: A | Discharge status: Home / Self Care | Payer: No Typology Code available for payment source | Source: Ambulatory Visit | Attending: Obstetrics and Gynecology | Admitting: Obstetrics and Gynecology

## 2019-04-16 ENCOUNTER — Ambulatory Visit (HOSPITAL_COMMUNITY)
Admission: RE | Admit: 2019-04-16 | Discharge: 2019-04-16 | Disposition: A | Discharge status: Home / Self Care | Payer: No Typology Code available for payment source | Source: Ambulatory Visit | Attending: Obstetrics and Gynecology | Admitting: Obstetrics and Gynecology

## 2019-04-16 DIAGNOSIS — Z1231 Encounter for screening mammogram for malignant neoplasm of breast: Secondary | ICD-10-CM

## 2019-04-16 DIAGNOSIS — Z1239 Encounter for other screening for malignant neoplasm of breast: Secondary | ICD-10-CM

## 2019-04-16 NOTE — Patient Instructions (Signed)
Explained breast self awareness with Merelin Medina-Leyva. Patient did not need a Pap smear today due to last Pap smear and HPV typing was 02/12/2018. Let her know BCCCP will cover Pap smears and HPV typing  every 5 years unless has a history of abnormal Pap smears. Referred patient to the [facility] Center of [facility] for a screening mammogram. Appointment scheduled for Tuesday, April 16, 2019 at 1250. Patient aware of appointment and will be there. Let patient know the Breast Center will follow up with her within the next couple weeks with results of mammogram by letter or phone. Melinna Medina-Leyva verbalized understanding.  Ancel Easler, Kathaleen Maser, RN 12:19 PM

## 2019-04-16 NOTE — Progress Notes (Signed)
Complaints of bilateral diffuse breast pain x one week. Patient rates the pain at a 5 out of 10. Patient stated she is due for her menstrual period.  Pap Smear: Pap smear not completed today. Last Pap smear was 02/12/2018 at Primary Care at [facility] and normal with negative HPV. Per patient has no history of an abnormal Pap smear. Last Pap smear result is in Epic.  Physical exam: Breasts Breasts symmetrical. No skin abnormalities bilateral breasts. No nipple retraction bilateral breasts. No nipple discharge bilateral breasts. No lymphadenopathy. No lumps palpated bilateral breasts. Complaints of bilateral diffuse breast tenderness on exam. Referred patient to the [facility] of [facility] for a screening mammogram. Appointment scheduled for Tuesday, April 16, 2019 at 1250.        Pelvic/Bimanual No Pap smear completed today since last Pap smear and HPV typing was 02/12/2018. Pap smear not indicated per BCCCP guidelines.   Smoking History: Patient has never smoked.  Patient Navigation: Patient education provided. Access to services provided for patient through [facility] program. Spanish interpreter provided.   Breast and Cervical Cancer Risk Assessment: Patient has no family history of breast cancer, known genetic mutations, or radiation treatment to the chest before age 21. Patient has no history of cervical dysplasia, immunocompromised, or DES exposure in-utero.  Risk Assessment    Risk Scores      04/16/2019 04/05/2018   Last edited by: Narda Rutherford, LPN Stoney Bang H, LPN   5-year risk: 0.5 % 0.5 %   Lifetime risk: 4.7 % 4.7 %         Used Spanish interpreter [name] from [place].

## 2020-04-16 ENCOUNTER — Other Ambulatory Visit: Payer: Self-pay | Admitting: Obstetrics and Gynecology

## 2020-04-16 DIAGNOSIS — Z1231 Encounter for screening mammogram for malignant neoplasm of breast: Secondary | ICD-10-CM

## 2020-05-28 ENCOUNTER — Other Ambulatory Visit: Payer: Self-pay

## 2020-05-28 ENCOUNTER — Ambulatory Visit: Payer: Self-pay | Admitting: *Deleted

## 2020-05-28 ENCOUNTER — Encounter (INDEPENDENT_AMBULATORY_CARE_PROVIDER_SITE_OTHER): Payer: Self-pay

## 2020-05-28 ENCOUNTER — Ambulatory Visit
Admission: RE | Admit: 2020-05-28 | Discharge: 2020-05-28 | Disposition: A | Discharge status: Home / Self Care | Payer: No Typology Code available for payment source | Source: Ambulatory Visit | Attending: Obstetrics and Gynecology | Admitting: Obstetrics and Gynecology

## 2020-05-28 VITALS — BP 128/80 | Wt 124.9 lb

## 2020-05-28 DIAGNOSIS — Z1231 Encounter for screening mammogram for malignant neoplasm of breast: Secondary | ICD-10-CM

## 2020-05-28 DIAGNOSIS — Z1239 Encounter for other screening for malignant neoplasm of breast: Secondary | ICD-10-CM

## 2020-05-28 NOTE — Patient Instructions (Signed)
Explained breast self awareness with April Zimmerman. Patient did not need a Pap smear today due to last Pap smear and HPV typing was 02/12/2018. Let her know BCCCP will cover Pap smears and HPV typing every 5 years unless has a history of abnormal Pap smears. Referred patient to the [facility] of [facility] for a screening mammogram on mobile unit. Appointment scheduled Thursday, May 28, 2020 at 0930. Patient escorted to the mobile unit following BCCCP appointment for her screening mammogram. Let patient know the Breast Center will follow up with her within the next couple weeks with results of mammogram by letter or phone. April Zimmerman verbalized understanding.  Kentaro Alewine, Kathaleen Maser, RN 8:40 AM

## 2020-05-28 NOTE — Progress Notes (Addendum)
[name] is a 50 y.o. female who presents to [facility] clinic today with complaint of bilateral breast soreness prior to menstrual period.    Pap Smear: Pap smear not completed today. Last Pap smear was 12/2/2019at Primary Care at [facility] normal with negative HPV. Per patient has no history of an abnormal Pap smear. Last Pap smear resultisin Epic.   Physical exam: Breasts Breasts symmetrical. No skin abnormalities bilateral breasts. No nipple retraction bilateral breasts. No nipple discharge bilateral breasts. No lymphadenopathy. No lumps palpated bilateral breasts. No complaints of pain or tenderness on exam.     MS DIGITAL SCREENING TOMO BILATERAL  Result Date: 04/17/2019 CLINICAL DATA:  Screening. EXAM: DIGITAL SCREENING BILATERAL MAMMOGRAM WITH TOMO AND CAD COMPARISON:  Previous exam(s). ACR Breast Density Category d: The breast tissue is extremely dense, which lowers the sensitivity of mammography FINDINGS: There are no findings suspicious for malignancy. Images were processed with CAD. IMPRESSION: No mammographic evidence of malignancy. A result letter of this screening mammogram will be mailed directly to the patient. RECOMMENDATION: Screening mammogram in one year. (Code:SM-B-01Y) BI-RADS CATEGORY  1: Negative. Electronically Signed   By: Sande Brothers M.D.   On: 04/17/2019 08:27   MS DIGITAL SCREENING TOMO BILATERAL  Result Date: 04/06/2018 CLINICAL DATA:  Screening. EXAM: DIGITAL SCREENING BILATERAL MAMMOGRAM WITH TOMO AND CAD COMPARISON:  None. ACR Breast Density Category d: The breast tissue is extremely dense, which lowers the sensitivity of mammography. FINDINGS: There are no findings suspicious for malignancy. Images were processed with CAD. IMPRESSION: No mammographic evidence of malignancy. A result letter of this screening mammogram will be mailed directly to the patient. RECOMMENDATION: Screening mammogram in one year. (Code:SM-B-01Y) BI-RADS CATEGORY  1:  Negative. Electronically Signed   By: Baird Lyons M.D.   On: 04/06/2018 09:54   Pelvic/Bimanual Pap is not indicated today per BCCCP guidelines.   Smoking History: Patient has never smoked.   Patient Navigation: Patient education provided. Access to services provided for patient through [facility] [facility] program. Spanish interpreter [name] from [facility] provided.   Colorectal Cancer Screening: Per patient has never had colonoscopy completed. No complaints today.    Breast and Cervical Cancer Risk Assessment: Patient does not have family history of breast cancer, known genetic mutations, or radiation treatment to the chest before age 20. Patient does not have history of cervical dysplasia, immunocompromised, or DES exposure in-utero.  Risk Assessment    Risk Scores      05/28/2020 04/16/2019   Last edited by: Narda Rutherford, LPN McGill, Sherie Demetrius Charity, LPN   5-year risk: 0.5 % 0.5 %   Lifetime risk: 4.6 % 4.7 %          A: BCCCP exam without pap smear Complaint of bilateral breast soreness prior to menstrual period.  P: Referred patient to the Breast Center of [facility] for a screening mammogram on mobile unit. Appointment scheduled Thursday, May 28, 2020 at 0930.  Priscille Heidelberg, RN 05/28/2020 8:40 AM

## 2020-06-02 ENCOUNTER — Other Ambulatory Visit: Payer: Self-pay | Admitting: Obstetrics and Gynecology

## 2020-06-02 DIAGNOSIS — R928 Other abnormal and inconclusive findings on diagnostic imaging of breast: Secondary | ICD-10-CM

## 2020-06-18 ENCOUNTER — Other Ambulatory Visit: Payer: Self-pay | Admitting: Obstetrics and Gynecology

## 2020-06-18 ENCOUNTER — Ambulatory Visit
Admission: RE | Admit: 2020-06-18 | Discharge: 2020-06-18 | Disposition: A | Discharge status: Home / Self Care | Payer: No Typology Code available for payment source | Source: Ambulatory Visit | Attending: Obstetrics and Gynecology | Admitting: Obstetrics and Gynecology

## 2020-06-18 ENCOUNTER — Other Ambulatory Visit: Payer: Self-pay

## 2020-06-18 ENCOUNTER — Ambulatory Visit: Payer: No Typology Code available for payment source

## 2020-06-18 ENCOUNTER — Other Ambulatory Visit: Payer: No Typology Code available for payment source

## 2020-06-18 DIAGNOSIS — N6320 Unspecified lump in the left breast, unspecified quadrant: Secondary | ICD-10-CM

## 2020-06-18 DIAGNOSIS — R928 Other abnormal and inconclusive findings on diagnostic imaging of breast: Secondary | ICD-10-CM

## 2020-06-23 ENCOUNTER — Ambulatory Visit
Admission: RE | Admit: 2020-06-23 | Discharge: 2020-06-23 | Disposition: A | Discharge status: Home / Self Care | Payer: No Typology Code available for payment source | Source: Ambulatory Visit | Attending: Obstetrics and Gynecology | Admitting: Obstetrics and Gynecology

## 2020-06-23 ENCOUNTER — Other Ambulatory Visit: Payer: Self-pay

## 2020-06-23 DIAGNOSIS — N6320 Unspecified lump in the left breast, unspecified quadrant: Secondary | ICD-10-CM

## 2020-06-23 HISTORY — PX: BREAST BIOPSY: SHX20

## 2020-06-25 ENCOUNTER — Encounter: Payer: Self-pay | Admitting: Obstetrics and Gynecology

## 2021-05-03 ENCOUNTER — Other Ambulatory Visit: Payer: Self-pay

## 2021-05-03 DIAGNOSIS — Z1231 Encounter for screening mammogram for malignant neoplasm of breast: Secondary | ICD-10-CM

## 2021-06-15 ENCOUNTER — Ambulatory Visit
Admission: RE | Admit: 2021-06-15 | Discharge: 2021-06-15 | Disposition: A | Discharge status: Home / Self Care | Payer: No Typology Code available for payment source | Source: Ambulatory Visit | Attending: Obstetrics and Gynecology | Admitting: Obstetrics and Gynecology

## 2021-06-15 ENCOUNTER — Ambulatory Visit: Payer: Self-pay | Admitting: *Deleted

## 2021-06-15 VITALS — BP 120/72 | Wt 125.4 lb

## 2021-06-15 DIAGNOSIS — Z1239 Encounter for other screening for malignant neoplasm of breast: Secondary | ICD-10-CM

## 2021-06-15 DIAGNOSIS — Z1211 Encounter for screening for malignant neoplasm of colon: Secondary | ICD-10-CM

## 2021-06-15 DIAGNOSIS — Z1231 Encounter for screening mammogram for malignant neoplasm of breast: Secondary | ICD-10-CM

## 2021-06-15 NOTE — Patient Instructions (Signed)
Explained breast self awareness with [facility] Adolphus [place]chwood. Patient did not need a Pap smear today due to last Pap smear and HPV typing was 02/12/2018. Let her know BCCCP will cover Pap smears and HPV typing every 5 years unless has a history of abnormal Pap smears. Referred patient to the Breast Center of [facility] for a screening mammogram on mobile unit. Appointment scheduled Tuesday, June 15, 2021 at 1020. Patient aware of appointment and will be there. Let patient know the Breast Center will follow up with her within the next couple weeks with results of her mammogram by letter or phone. Jariyah Estela Adolphus Birchwood verbalized understanding. ? ?Ismar Yabut, Kathaleen Maser, RN ?9:18 AM ? ? ? ? ?

## 2021-06-15 NOTE — Progress Notes (Signed)
[name] is a 51 y.o. female who presents to [facility] clinic today with no complaints.  ?  ?Pap Smear: Pap smear not completed today. Last Pap smear was 02/12/2018 at [facility] and normal with negative HPV. Per patient has no history of an abnormal Pap smear. Last Pap smear result is available in Epic. ?  ?Physical exam: ?Breasts ?Breasts symmetrical. No skin abnormalities bilateral breasts. No nipple retraction bilateral breasts. No nipple discharge bilateral breasts. No lymphadenopathy. No lumps palpated bilateral breasts. No complaints of pain or tenderness. ? ?MS DIGITAL SCREENING TOMO BILATERAL ? ?Result Date: 05/29/2020 ?CLINICAL DATA:  Screening. EXAM: DIGITAL SCREENING BILATERAL MAMMOGRAM WITH TOMOSYNTHESIS AND CAD TECHNIQUE: Bilateral screening digital craniocaudal and mediolateral oblique mammograms were obtained. Bilateral screening digital breast tomosynthesis was performed. The images were evaluated with computer-aided detection. COMPARISON:  Previous exam(s). ACR Breast Density Category d: The breast tissue is extremely dense, which lowers the sensitivity of mammography. FINDINGS: In the right breast a possible distortion requires further evaluation. In the left breast a possible mass requires further evaluation. IMPRESSION: Further evaluation is suggested for possible distortion in the right breast. Further evaluation is suggested for a possible mass in the left breast. RECOMMENDATION: Diagnostic mammogram and possibly ultrasound of both breasts. (Code:FI-B-84M) The patient will be contacted regarding the findings, and additional imaging will be scheduled. BI-RADS CATEGORY  0: Incomplete. Need additional imaging evaluation and/or prior mammograms for comparison. Electronically Signed   By: Frederico Hamman M.D.   On: 05/29/2020 11:52  ? ?MS DIGITAL SCREENING TOMO BILATERAL ? ?Result Date: 04/17/2019 ?CLINICAL DATA:  Screening. EXAM: DIGITAL SCREENING BILATERAL  MAMMOGRAM WITH TOMO AND CAD COMPARISON:  Previous exam(s). ACR Breast Density Category d: The breast tissue is extremely dense, which lowers the sensitivity of mammography FINDINGS: There are no findings suspicious for malignancy. Images were processed with CAD. IMPRESSION: No mammographic evidence of malignancy. A result letter of this screening mammogram will be mailed directly to the patient. RECOMMENDATION: Screening mammogram in one year. (Code:SM-B-01Y) BI-RADS CATEGORY  1: Negative. Electronically Signed   By: Sande Brothers M.D.   On: 04/17/2019 08:27  ? ?MS DIGITAL SCREENING TOMO BILATERAL ? ?Result Date: 04/06/2018 ?CLINICAL DATA:  Screening. EXAM: DIGITAL SCREENING BILATERAL MAMMOGRAM WITH TOMO AND CAD COMPARISON:  None. ACR Breast Density Category d: The breast tissue is extremely dense, which lowers the sensitivity of mammography. FINDINGS: There are no findings suspicious for malignancy. Images were processed with CAD. IMPRESSION: No mammographic evidence of malignancy. A result letter of this screening mammogram will be mailed directly to the patient. RECOMMENDATION: Screening mammogram in one year. (Code:SM-B-01Y) BI-RADS CATEGORY  1: Negative. Electronically Signed   By: Baird Lyons M.D.   On: 04/06/2018 09:54  ? ?MS DIGITAL DIAG TOMO BILAT ? ?Result Date: 06/18/2020 ?CLINICAL DATA:  51 year old female for further evaluation of possible RIGHT breast distortion and possible LEFT breast mass on screening mammogram. EXAM: DIGITAL DIAGNOSTIC BILATERAL MAMMOGRAM WITH TOMOSYNTHESIS AND CAD; ULTRASOUND LEFT BREAST LIMITED TECHNIQUE: Bilateral digital diagnostic mammography and breast tomosynthesis was performed. The images were evaluated with computer-aided detection.; Targeted ultrasound examination of the left breast was performed COMPARISON:  Previous exam(s). ACR Breast Density Category c: The breast tissue is heterogeneously dense, which may obscure small masses. FINDINGS: 2D/3D full field and spot  compression views of both breasts demonstrate no persistent distortion or suspicious abnormality at the site of the RIGHT breast screening study finding. A persistent circumscribed oval mass within the UPPER  INNER LEFT breast is identified. Targeted ultrasound is performed, showing a 1.7 x 0.7 x 1.4 cm circumscribed oval hypoechoic parallel mass at the 10 o'clock position of the LEFT breast 5 cm from the nipple. No abnormal LEFT axillary lymph nodes are identified. IMPRESSION: 1. 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 We discussed management options including excision, ultrasound-guided core biopsy, and close follow-up. The patient desires tissue sampling. 2. No persistent distortion or suspicious abnormality at the site of the RIGHT breast screening study finding. RECOMMENDATION: Ultrasound-guided LEFT breast biopsy, which will be scheduled. I have discussed the findings and recommendations with the patient. If applicable, a reminder letter will be sent to the patient regarding the next appointment. BI-RADS CATEGORY  3: Probably benign. Electronically Signed   By: Harmon Pier M.D.   On: 06/18/2020 16:08  ? ?MM CLIP PLACEMENT LEFT ? ?Result Date: 06/23/2020 ?CLINICAL DATA:  51 year old female status post ultrasound biopsy of the left breast. EXAM: DIAGNOSTIC LEFT MAMMOGRAM POST ULTRASOUND BIOPSY COMPARISON:  Previous exam(s). FINDINGS: Mammographic images were obtained following ultrasound guided biopsy of the left breast. The biopsy marking clip is in expected position at the site of biopsy. IMPRESSION: Appropriate positioning of the ribbon shaped biopsy marking clip at the site of biopsy in the upper inner left breast. Final Assessment: Post Procedure Mammograms for Marker Placement Electronically Signed   By: Sande Brothers M.D.   On: 06/23/2020 13:34   ?     ?Pelvic/Bimanual ?Pap is not indicated today per BCCCP ?  ?Smoking History: ?Patient has never smoked. ?  ?Patient  Navigation: ?Patient education provided. Access to services provided for patient through [place] program. Spanish interpreter [name] from [facility] provided.  ? ?Colorectal Cancer Screening: ?Per patient has never had colonoscopy completed. FIT Test given to patient to complete. No complaints today.  ?  ?Breast and Cervical Cancer Risk Assessment: ?Patient does not have family history of breast cancer, known genetic mutations, or radiation treatment to the chest before age 44. Patient does not have history of cervical dysplasia, immunocompromised, or DES exposure in-utero. ? ?Risk Assessment   ? ? Risk Scores   ? ?   06/15/2021 05/28/2020  ? Last edited by: Narda Rutherford, LPN [place], Fidel Levy, LPN  ? 5-year risk: 0.6 % 0.5 %  ? Lifetime risk: 5.3 % 4.6 %  ? ?  ?  ? ?  ? ? ?A: ?BCCCP exam without pap smear ?No complaints. ? ?P: ?Referred patient to the [facility] for a screening mammogram on mobile unit. Appointment scheduled Tuesday, June 15, 2021 at 1020. ?  ? ?Priscille Heidelberg, RN ?06/15/2021 9:18 AM   ?

## 2021-07-08 LAB — FECAL OCCULT BLOOD, IMMUNOCHEMICAL: Fecal Occult Bld: NEGATIVE

## 2021-07-09 ENCOUNTER — Telehealth: Payer: Self-pay

## 2021-07-09 NOTE — Telephone Encounter (Signed)
Via Julie Sowell, Spanish Interpreter ([place]), Patient informed negative FIT test results. Patient verbalized understanding.  

## 2021-07-29 ENCOUNTER — Other Ambulatory Visit: Payer: Self-pay

## 2021-07-29 ENCOUNTER — Inpatient Hospital Stay: Payer: Self-pay | Attending: Obstetrics and Gynecology | Admitting: *Deleted

## 2021-07-29 VITALS — BP 114/80 | Ht <= 58 in | Wt 122.5 lb

## 2021-07-29 DIAGNOSIS — Z0000 Encounter for general adult medical examination without abnormal findings: Secondary | ICD-10-CM

## 2021-07-29 NOTE — Progress Notes (Signed)
[facility] initial screening   Interpreter- [name], [employer]   Clinical Measurement: There were no vitals filed for this visit. Fasting Labs Drawn Today, will review with patient when they result.   Medical History:  Patient states that she  doesn't know if she has  high cholesterol, does not have high blood pressure and she  doesn't know if she has  diabetes.  Medications:  Patient states that she does not take medication to lower cholesterol, blood pressure or blood sugar.  Patient does not take an aspirin a day to help prevent a heart attack or stroke.    Blood pressure, self measurement: Patient states that she does not measure blood pressure from home. She checks her blood pressure N/A. She shares her readings with a health care provider: N/A.   Nutrition: Patient states that on average she eats 1 cups of fruit and 2 cups of vegetables per day. Patient states that she does not eat fish at least 2 times per week. Patient eats more than half servings of whole grains. Patient drinks less than 36 ounces of beverages with added sugar weekly: yes. Patient is currently watching sodium or salt intake: yes. In the past 7 days patient has consumed drinks containing alcohol on 0 days. On a day that patient consumes drinks containing alcohol on average 0 drinks are consumed.      Physical activity:  Patient states that she gets 45 minutes of moderate and 45 minutes of vigorous physical activity each week.  Smoking status:  Patient states that she has has never smoked .   Quality of life:  Over the past 2 weeks patient states that she had little interest or pleasure in doing things: several days. She has been feeling down, depressed or hopeless:several days.    Risk reduction and counseling:    Health Coaching: Spoke with patient about the daily recommendation for fruits and vegetables. Showed patient what a serving size would look like. Patient consumes a variety of whole grains including:  oatmeal, whole grain bread and brown rice. Patient has been walking or running 3 days a week for 30 minutes.   Goal: Patient would like to exercise more. Patient would like to get a gym membership so that she can exercise more often. Patient would like to exercise 3-4 days a week for at least 30 minutes a day.    Navigation:  I will notify patient of lab results.  Patient is aware of 2 more health coaching sessions and a follow up.  Time: 20 minutes

## 2021-07-30 LAB — HEMOGLOBIN A1C
Est. average glucose Bld gHb Est-mCnc: 123 mg/dL
Hgb A1c MFr Bld: 5.9 % — ABNORMAL HIGH (ref 4.8–5.6)

## 2021-07-30 LAB — LIPID PANEL
Chol/HDL Ratio: 3.8 ratio (ref 0.0–4.4)
Cholesterol, Total: 223 mg/dL — ABNORMAL HIGH (ref 100–199)
HDL: 59 mg/dL (ref >39)
LDL Chol Calc (NIH): 149 mg/dL — ABNORMAL HIGH (ref 0–99)
Triglycerides: 86 mg/dL (ref 0–149)
VLDL Cholesterol Cal: 15 mg/dL (ref 5–40)

## 2021-07-30 LAB — GLUCOSE, RANDOM: Glucose: 98 mg/dL (ref 70–99)

## 2021-08-02 ENCOUNTER — Telehealth: Payer: Self-pay

## 2021-08-02 NOTE — Telephone Encounter (Signed)
Health coaching 2   interpreter- [name], [employer]   Labs- 223 cholesterol, 149 LDL cholesterol, 86 triglycerides, 59 HDL cholesterol, 5.9 hemoglobin A1C, 98 mean plasma glucose  Patient understands and is aware of her lab results.   Goals-  1. Reduce the amount of fried and fatty foods consumed. 2. Reduce the amount of sugars consumed in both food and drinks. 3. Reduce the amount of carbs consumed. 4. Exercise daily for 20-30 minutes.   Navigation:  Patient is aware of 1 more health coaching sessions and a follow up. Referred patient to PCP at Primary Care @ [facility] for FU for elevated labs. Will call patient back with appointment information once scheduled. Referred patient to the [facility] [place] [facility].   Time-  15 minutes

## 2021-08-10 ENCOUNTER — Ambulatory Visit: Payer: No Typology Code available for payment source | Admitting: Physician Assistant

## 2021-08-11 ENCOUNTER — Encounter: Payer: Self-pay | Admitting: Physician Assistant

## 2021-08-11 ENCOUNTER — Ambulatory Visit (INDEPENDENT_AMBULATORY_CARE_PROVIDER_SITE_OTHER): Payer: Self-pay | Admitting: Physician Assistant

## 2021-08-11 VITALS — BP 120/74 | HR 80 | Temp 98.9°F | Resp 18 | Ht 60.0 in | Wt 123.0 lb

## 2021-08-11 DIAGNOSIS — Z114 Encounter for screening for human immunodeficiency virus [HIV]: Secondary | ICD-10-CM

## 2021-08-11 DIAGNOSIS — E782 Mixed hyperlipidemia: Secondary | ICD-10-CM

## 2021-08-11 DIAGNOSIS — R7303 Prediabetes: Secondary | ICD-10-CM

## 2021-08-11 DIAGNOSIS — Z6824 Body mass index (BMI) 24.0-24.9, adult: Secondary | ICD-10-CM

## 2021-08-11 NOTE — Progress Notes (Signed)
Patient has eaten today. ?Patient has not taken medication today. ?Patient denies pain at this time. ?

## 2021-08-11 NOTE — Patient Instructions (Signed)
I encourage you to follow a low sugar, low cholesterol diet.  A good one to follow is Mediterranean style eating   I encourage 20-30 minutes of cardiovascular exercise 3-4 times a week as well.  I also encourage you to take Omega-3 fish oil daily.  You can purchase these over the counter.  Kennieth Rad, PA-C Physician Assistant [facility] [email or web address]  Dieta mediterrnea [place] Diet El trmino "dieta mediterrnea" hace referencia a elecciones de alimentacin y de estilo de vida basadas en las tradiciones de los pases ubicados en las costas del mar Mediterrneo. Se centra en comer ms frutas, verduras, cereales integrales, legumbres, frutos secos, semillas y grasas cardiosaludables, y comer menos productos lcteos, carne, huevos y alimentos procesados con azcar agregada, sal y Wendee Copp. Se ha demostrado que esta dieta ayuda a prevenir determinadas afecciones y a Coca-Cola que padecen enfermedades crnicas, como enfermedades renales y cardacas. Consejos para seguir Photographer las etiquetas de los alimentos Verificar el tamao de la porcin de los alimentos envasados. En el caso del arroz y la pasta, el tamao de la porcin se refiere a la cantidad de alimento cocido, no seco. Controlar la cantidad total de grasa de los alimentos envasados. Evite los que contienen grasas saturadas o trans. Lea la lista de ingredientes para determinar si los alimentos contienen azcares agregados, como jarabe de maz. Al ir de compras  Compre una variedad de alimentos que ofrezcan una dieta equilibrada, por ejemplo: Frutas y verduras frescas. Cereales, frijoles, frutos secos y semillas. Algunos de estos productos pueden estar disponibles sueltos o en grandes cantidades (a granel). Mariscos frescos. Aves y [place]. Productos lcteos con bajo contenido de [place]. Compre ingredientes frescos en lugar de alimentos  preenvasados. Compre frutas y verduras de estacin frescas en mercados de granjeros locales. Compre frutas y verduras congeladas solas. Si no tiene acceso a Marketing executive de buena calidad, compre camarones congelados precocidos o pescado en lata, como atn, salmn o sardinas. Surta su despensa para tener siempre determinados productos a mano, por ejemplo, aceite de oliva, atn en lata, tomates en lata, arroz, pasta y frijoles. Al cocinar Cocine con aceite de oliva extra virgen en lugar de usar mantequilla u otros aceites vegetales. Use las carnes como guarnicin y las verduras o los cereales como plato principal. Esto significa consumir porciones pequeas de carne o agregarla a la pasta o a los guisos en pequeas cantidades. Use frijoles o verduras en lugar de carne en platos comunes como chili o lasaa. Experimente con diferentes mtodos de coccin. Intente asar, cocer al vapor y saltear verduras. Agregue verduras congeladas a sopas, guisos, pasta o arroz. Agregue frutos secos o semillas para cubrir la porcin de grasas saludables y protena vegetal en cada comida. Puede agregarlos a yogures, ensaladas o platos con verduras. [place] verduras con aceite de [place], [place] de [place], ajo y Patent attorney. Planificacin de las comidas Planifique una comida vegetariana un da a la semana. Si es posible, intente agregar [employer]. Coma mariscos dos o ms veces por semana. Tenga a mano bocadillos saludables, por ejemplo: Bastones de verduras con humus. Yogur griego. Mezcla de frutos secos y frutas. Consuma comidas equilibradas toda la semana. Esto puede comprender lo siguiente: Fruta: 2 o 3 porciones por Training and development officer. Verdura: 4 o 5 porciones por Training and development officer. Productos lcteos descremados: 2 porciones por Training and development officer. Carne magra, de ave o de pescado: 1 porcin por Training and development officer. Frijoles y legumbres: 2 o ms porciones  por semana. Frutos secos y semillas: 91 o 2 porciones por Training and development officer. Cereales  integrales: 6 a 8 porciones por da. Aceite de oliva extra virgen: 3 o 4 porciones por da. Limite las carnes rojas y los dulces a solo unas porciones al mes. Estilo de vida  Cocine y coma en familia, cuando sea posible. Beba suficiente lquido como para Theatre manager la orina de color amarillo plido. [place]. Esto puede comprender lo siguiente: Ejercicios aerbicos, Film/video editor. Actividades recreativas, como jardinera, caminatas o tareas domsticas. Intente dormir de 7 a 8 horas todas las noches. Si el mdico se lo recomienda, consuma vino tinto con moderacin. Esto significa 1 vaso por da para mujeres que no estn embarazadas y 2 vasos por da para los hombres. Un vaso de vino equivale a 5 oz (150 ml). Qu alimentos debo comer? Frutas Manzanas. Damascos. Agu[place]. Bayas. Bananas. Cerezas. Dtiles. Higos. Uvas. Limones. Meln. Naranjas. Duraznos. Ciruelas. [place]. Verduras Alcachofas. Remolachas. Brcoli. Repollo. Zanahorias. Augustin Coupe. Judas verdes. Acelga. Col rizada. Espinaca. Cebollas. Puerro. Guisantes. Cal[place]aza. Tomates. Pimientos. Rbanos. Granos Pastas integrales. Arroz integral. Gavin Potters burgol. Polenta. Cuscs. Pan integral. Avena. Quinua. Carnes y otras protenas Optometrist. Almendras. Semillas de girasol. Piones. Manes. Bacalao. Salmn. Vieiras. Camarones. Atn. Tilapia. Almejas. Ostras. Huevos. Carne de ave sin piel. Lcteos Leche con bajo contenido de [place]. Queso. Yogur griego. Grasas y aceites Aceite de oliva extra virgen. Aceite de aguacate. Aceite de semillas de uva. [employer]. Vino tinto. T de hierbas. Dulces y postres Yogur griego con miel. Manzanas asadas. Peras asadas. Mezcla de frutos secos. Alios y condimentos [place]. Cilantro. Coriandro. Comino. Menta. Perejil. Salvia. Westley Hummer. Estragn. Ajo. Organo. Tomillo. Pimienta. Sunflower. Hummus. Salsa de tomate. Aceitunas. Hongos. Es posible que los productos que  se enumeran ms [place] no constituyan una lista completa de los alimentos y las bebidas que puede tomar. Consulte a un nutricionista para obtener ms informacin. Qu alimentos debo limitar? Esta es una lista de los alimentos que se deben comer con muy poca frecuencia o solo en ocasiones especiales. Lambert Mody Frutas enlatadas con almbar. Verduras Papas fritas. Granos Comidas con arroz o pasta preenvasada. Cereales preenvasados con azcar agregada. Colaciones preenvasadas con azcar agregada. Carnes y otras protenas Carne de vaca. Cerdo. Cordero. Carne de ave con piel. Perros calientes. Tocino. Lcteos Helado. Phoebe Sharps. Leche entera. Grasas y [employer]. Aceite de canola. Aceite vegetal. Grasa de carne de res. Tsaile. Bebidas Jugos. Refrescos endulzados con azcar. Cerveza. Licores y bebidas espirituosas. Dulces y SunTrust. Bizcochuelos. Pasteles. Caramelos. Alios y condimentos [place]. Salsas y adobos listos para consumir. Es posible que los productos que se enumeran ms [place] no constituyan una lista completa de los alimentos y las bebidas que debe limitar. Consulte a un nutricionista para obtener ms informacin. Resumen La dieta mediterrnea implica elecciones de alimentos y de estilo de vida. Consuma una variedad de frutas y verduras frescas, frijoles, frutos secos, semillas y cereales integrales. Limite la cantidad de carnes rojas y dulces. Si el mdico se lo recomienda, consuma vino tinto con moderacin. Esto significa 1 vaso por da para mujeres que no estn embarazadas y 2 vasos por da para los hombres. Un vaso de vino equivale a 5 oz (150 ml). Esta informacin no tiene Marine scientist el consejo del mdico. Asegrese de hacerle al mdico cualquier pregunta que tenga. Document Revised: 09/29/2020 Document Reviewed: 04/05/2019 Elsevier Patient Education  [place].

## 2021-08-11 NOTE — Progress Notes (Unsigned)
New Patient Office Visit  Subjective    Patient ID: April Zimmerman, female    DOB: 1970/06/06  Age: 51 y.o. MRN: [id number]  CC:  Chief Complaint  Patient presents with   Prediabetes    HPI [facility] [place] states that she was seen by community outreach for a well adult health check and was found to have prediabetes with an A1c of 5.9, and elevated cholesterol.  States today that she eats a healthy diet, stays low-carb without meat, states that she has a very active lifestyle but does not do any cardiovascular exercise, states that she drinks approximately 4 bottles of water a day.  The 10-year ASCVD risk score (Arnett DK, et al., 2019) is: 1.3%   Values used to calculate the score:     Age: 1 years     Sex: Female     Is Non-Hispanic African American: No     Diabetic: No     Tobacco smoker: No     Systolic Blood Pressure: 120 mmHg     Is BP treated: No     HDL Cholesterol: 59 mg/dL     Total Cholesterol: 223 mg/dL  Outpatient Encounter Medications as of 08/11/2021  Medication Sig   dicyclomine (BENTYL) 20 MG tablet Take 1 tablet by mouth 4 (four) times daily as needed. (Patient not taking: Reported on 07/29/2021)   omeprazole (PRILOSEC) 20 MG capsule Take 20 mg by mouth daily. PRN only (Patient not taking: Reported on 07/29/2021)   sucralfate (CARAFATE) 1 g tablet Take 1 tablet by mouth 2 (two) times daily. (Patient not taking: Reported on 07/29/2021)   triamcinolone cream (KENALOG) 0.1 % Apply 1 application topically 2 (two) times daily as needed. (Patient not taking: Reported on 04/05/2018)   No facility-administered encounter medications on file as of 08/11/2021.    Past Medical History:  Diagnosis Date   Acid reflux     Past Surgical History:  Procedure Laterality Date   BREAST BIOPSY Left 06/23/2020   fibroadenoma   TUBAL LIGATION      Family History  Problem Relation Age of Onset   Diabetes Mother     Social History   Socioeconomic  History   Marital status: Married    Spouse name: Not on file   Number of children: 7   Years of education: Not on file   Highest education level: 8th grade  Occupational History   Not on file  Tobacco Use   Smoking status: Never   Smokeless tobacco: Never  Vaping Use   Vaping Use: Never used  Substance and Sexual Activity   Alcohol use: Never   Drug use: Never   Sexual activity: Yes    Birth control/protection: Surgical  Other Topics Concern   Not on file  Social History Narrative   ** Merged History Encounter **       Social Determinants of Health   Financial Resource Strain: Not on file  Food Insecurity: No Food Insecurity   Worried About Programme researcher, broadcasting/film/video in the Last Year: Never true   Ran Out of Food in the Last Year: Never true  Transportation Needs: No Transportation Needs   Lack of Transportation (Medical): No   Lack of Transportation (Non-Medical): No  Physical Activity: Not on file  Stress: Not on file  Social Connections: Not on file  Intimate Partner Violence: Not on file    Review of Systems  Constitutional: Negative.   HENT: Negative.  Eyes: Negative.   Respiratory:  Negative for shortness of breath.   Cardiovascular:  Negative for chest pain.  Gastrointestinal: Negative.   Genitourinary: Negative.   Musculoskeletal: Negative.   Skin: Negative.   Neurological: Negative.   Endo/Heme/Allergies: Negative.   Psychiatric/Behavioral: Negative.         Objective    BP 120/74 (BP Location: Right Arm, Patient Position: Sitting, Cuff Size: Normal)   Pulse 80   Temp 98.9 F (37.2 C) (Oral)   Resp 18   Ht 5' (1.524 m)   Wt 123 lb (55.8 kg)   LMP 07/21/2021   SpO2 98%   BMI 24.02 kg/m   Physical Exam Vitals and nursing note reviewed.  Constitutional:      Appearance: Normal appearance.  Musculoskeletal:     Cervical back: Normal range of motion and neck supple.  Neurological:     Mental Status: She is alert.    {Labs  (Optional):23779}    Assessment & Plan:   Problem List Items Addressed This Visit   None   No follow-ups on file.   Kasandra Knudsen Mayers, PA-C

## 2021-08-12 ENCOUNTER — Telehealth: Payer: Self-pay

## 2021-08-12 LAB — COMP. METABOLIC PANEL (12)
AST: 16 IU/L (ref 0–40)
Albumin/Globulin Ratio: 1.3 (ref 1.2–2.2)
Albumin: 4.2 g/dL (ref 3.8–4.9)
Alkaline Phosphatase: 77 IU/L (ref 44–121)
BUN/Creatinine Ratio: 13 (ref 9–23)
BUN: 7 mg/dL (ref 6–24)
Bilirubin Total: 0.2 mg/dL (ref 0.0–1.2)
Calcium: 9.2 mg/dL (ref 8.7–10.2)
Chloride: 103 mmol/L (ref 96–106)
Creatinine, Ser: 0.56 mg/dL — ABNORMAL LOW (ref 0.57–1.00)
Globulin, Total: 3.3 g/dL (ref 1.5–4.5)
Glucose: 115 mg/dL — ABNORMAL HIGH (ref 70–99)
Potassium: 4 mmol/L (ref 3.5–5.2)
Sodium: 139 mmol/L (ref 134–144)
Total Protein: 7.5 g/dL (ref 6.0–8.5)
eGFR: 110 mL/min/{1.73_m2} (ref >59)

## 2021-08-12 LAB — VITAMIN D 25 HYDROXY (VIT D DEFICIENCY, FRACTURES): Vit D, 25-Hydroxy: 26.1 ng/mL — ABNORMAL LOW (ref 30.0–100.0)

## 2021-08-12 LAB — HIV ANTIBODY (ROUTINE TESTING W REFLEX): HIV Screen 4th Generation wRfx: NONREACTIVE

## 2021-08-12 NOTE — Telephone Encounter (Signed)
-----   Message from Kennieth Rad, [place] sent at 08/12/2021 11:52 AM EDT ----- Please call patient and let her know that her kidney function and liver function are within normal limits, she does show signs of dehydration.  Her screening for HIV was negative.  Her vitamin D levels are below normal limits.  She should take 2000 units once daily.  She will purchase this over-the-counter.

## 2021-11-09 ENCOUNTER — Ambulatory Visit: Payer: Self-pay | Admitting: Family Medicine

## 2021-12-01 ENCOUNTER — Telehealth: Payer: Self-pay

## 2021-12-01 NOTE — Telephone Encounter (Signed)
Health Coaching 3  interpreter- [name], [employer]   Goals- Patient states that she has eliminated red meat from diet. Patient has cut back on the amount of cheeses that she consumes. Patient has also increased fruit and vegetable intake. Patient consumes on average 2 servings of fruit per day and 3 servings of vegetables per day.    New goal- Patient would still like to work on exercise. Patient has been running 45 minutes 3 days a week and has been doing zumba classes 2 days a week. Patient wants to join a gym as her next health goal related to exercise. Patient will call local gyms to get pricing for new member memberships.   Barrier to reaching goal-    Strategies to overcome-    Navigation:  Patient is aware of  a follow up session. Patient is scheduled for FU on 11/1 @ 11:00 am.   Time-  10 minutes

## 2021-12-06 ENCOUNTER — Ambulatory Visit: Payer: Self-pay | Admitting: Family Medicine

## 2022-01-12 ENCOUNTER — Inpatient Hospital Stay: Payer: Self-pay | Attending: Obstetrics and Gynecology | Admitting: *Deleted

## 2022-01-12 VITALS — BP 120/78 | Ht <= 58 in | Wt 124.1 lb

## 2022-01-12 DIAGNOSIS — Z0000 Encounter for general adult medical examination without abnormal findings: Secondary | ICD-10-CM

## 2022-01-12 NOTE — Progress Notes (Signed)
Wisewoman follow up   Interpreter: [name], [employer]  Clinical Measurement:   Vitals:   01/12/22 1115  BP: 124/78      Medical History:  Patient states that she has high cholesterol, does not have high blood pressure and she does not have diabetes.  Medications:  Patient states that she does not take medication to lower cholesterol, blood pressure and blood sugar.  Patient does not take an aspirin a day to help prevent a heart attack or stroke.    Blood pressure, self measurement: Patient states that she does not measure blood pressure from home. She checks her blood pressure N/A. She shares her readings with a health care provider: N/A.   Nutrition: Patient states that on average she eats 1 cups of fruit and 1 cups of vegetables per day. Patient states that she does not eat fish at least 2 times per week. Patient eats more than half servings of whole grains. Patient drinks less than 36 ounces of beverages with added sugar weekly: yes. Patient is currently watching sodium or salt intake: yes. In the past 7 days patient has had 0 drinks containing alcohol. On average patient drinks 0 drinks containing alcohol per day.      Physical activity:  Patient states that she gets 210 minutes of moderate and 135 minutes of vigorous physical activity each week.  Smoking status:  Patient states that she has has never smoked .   Quality of life:  Over the past 2 weeks patient states that she had little interest or pleasure in doing things: not at all. She has been feeling down, depressed or hopeless:not at all.    Risk reduction and counseling:   Health Coaching: Spoke with patient about continuing to try and add fruits and vegetables into daily diet. Patient has been eating a variety of whole grains (whole wheat bread, oatmeal, brown rice and whole wheat pasta). Patient has cut back on the amount of soda that she consumes. Patient has been walking on the treadmill daily for 30 minutes and doing  zumba classes from home 2-3 days a week for 45 minutes. Patient unfortunately was not able to participate in the [facility] program because of scheduling issues.    Navigation: This was the  follow up session for this patient, I will check up on her progress in the coming months.  Time: 20 minutes

## 2022-05-18 IMAGING — MG MM DIGITAL SCREENING BILAT W/ TOMO AND CAD
8 series · 9 of 24 positions shown · non-contrast
Comparison: Previous exam(s).

CLINICAL DATA: Screening.

EXAM:
DIGITAL SCREENING BILATERAL MAMMOGRAM WITH TOMOSYNTHESIS AND CAD
TECHNIQUE: Bilateral screening digital craniocaudal and mediolateral oblique
mammograms were obtained. Bilateral screening digital breast
tomosynthesis was performed. The images were evaluated with
computer-aided detection.

[R MLO synth-2D]
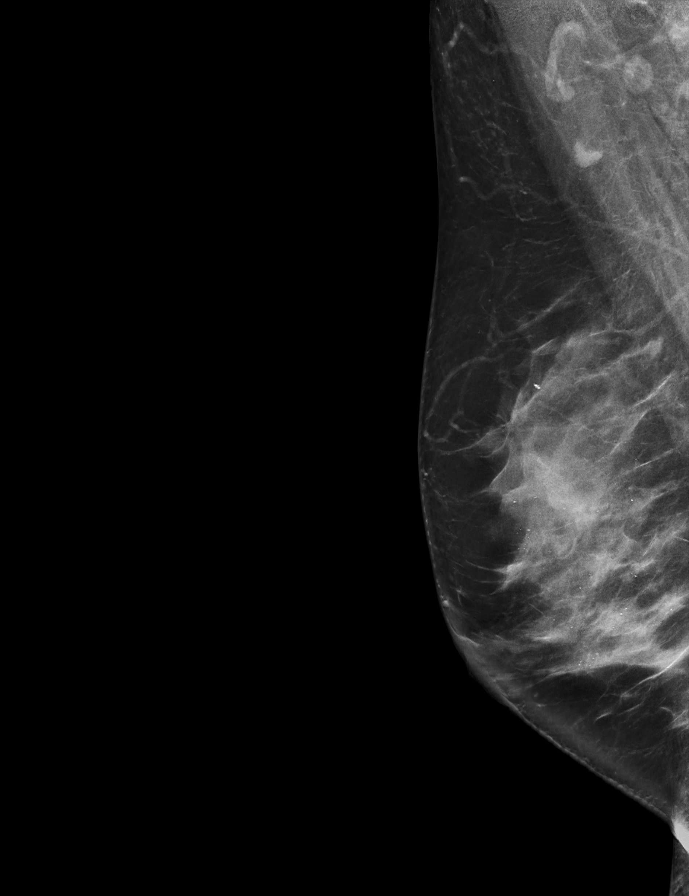

[L CC synth-2D]
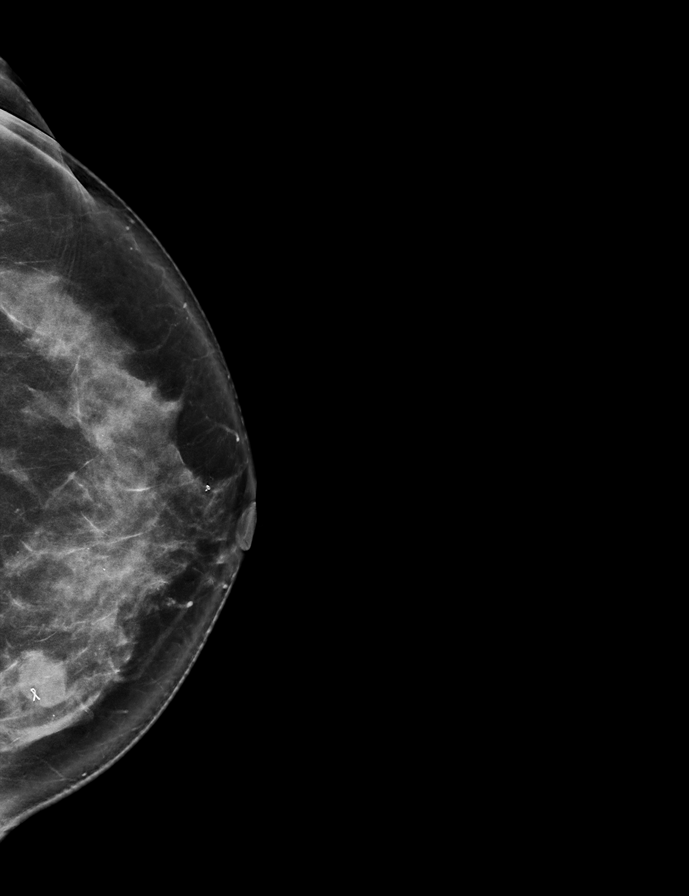

[R CC synth-2D]
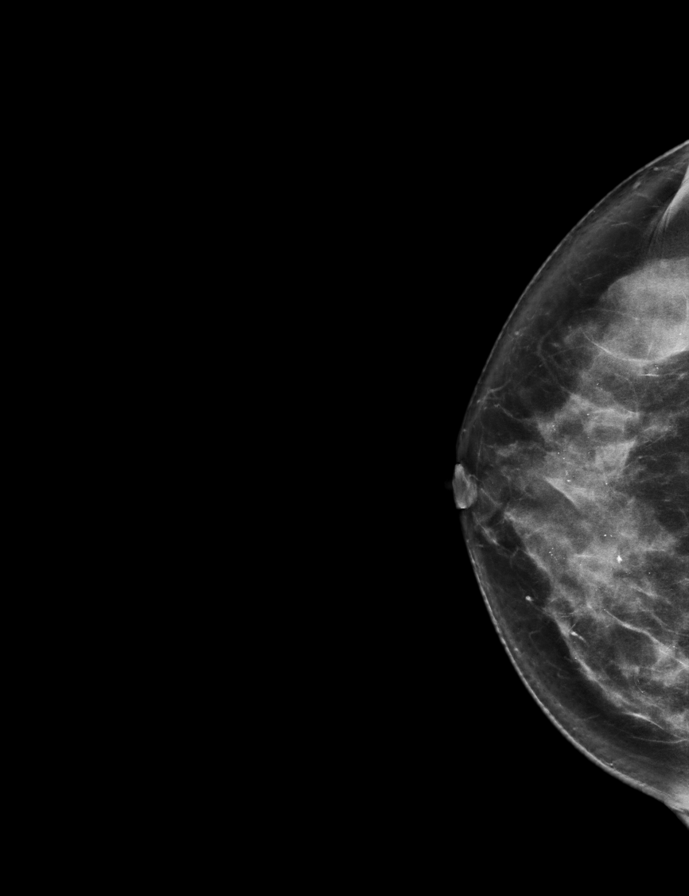

[L MLO synth-2D]
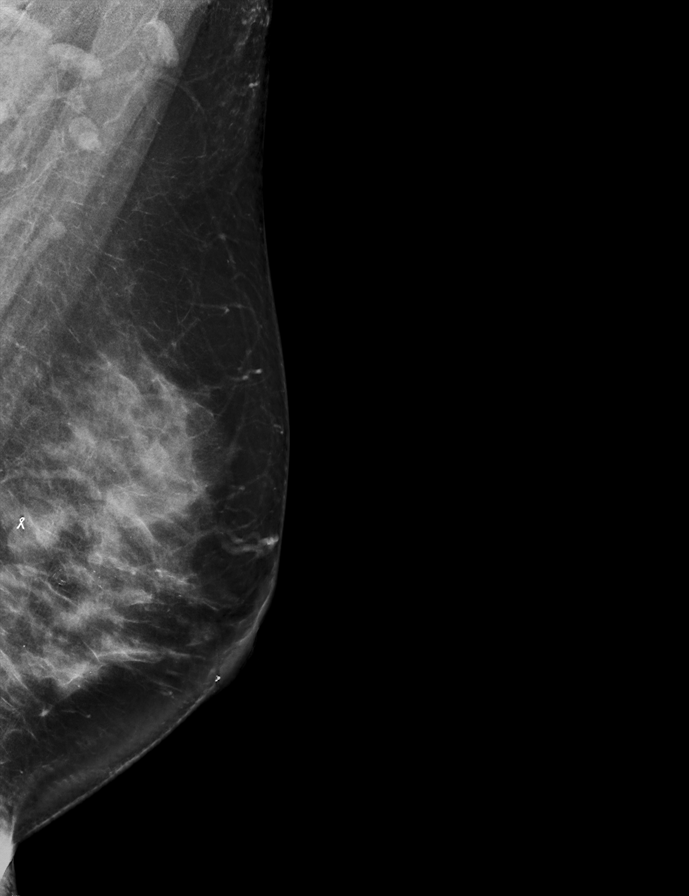

[R CC tomo · 2 of 69 frames shown]
[frame 23/69]
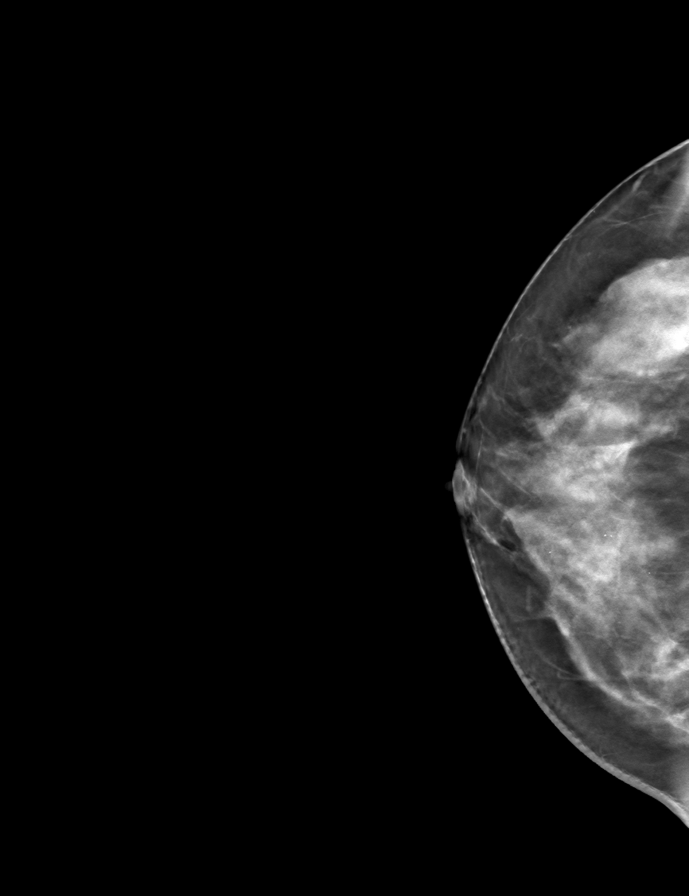
[frame 35/69]
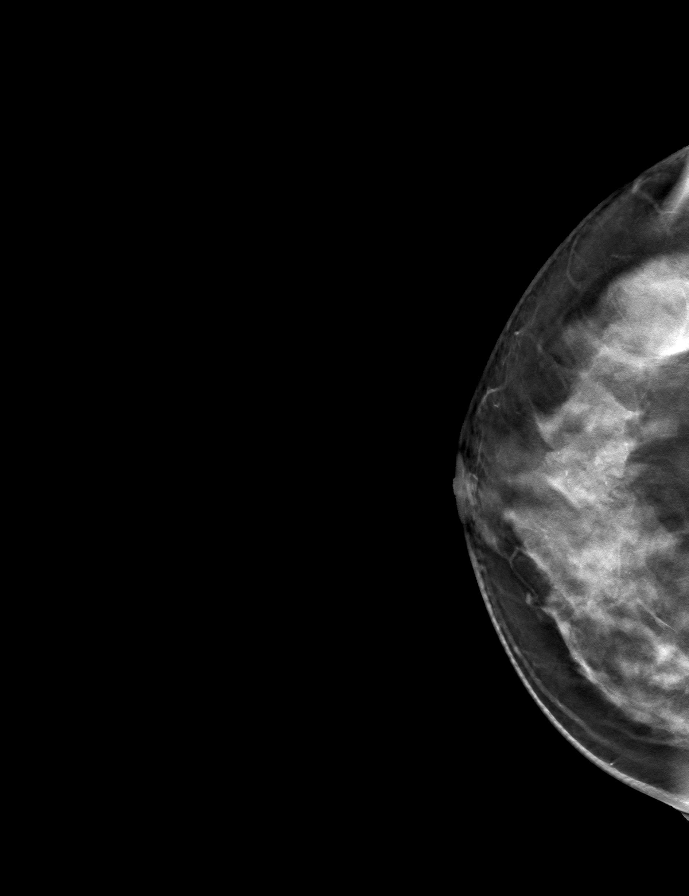

[L MLO tomo · tomo slice 41/80.0]
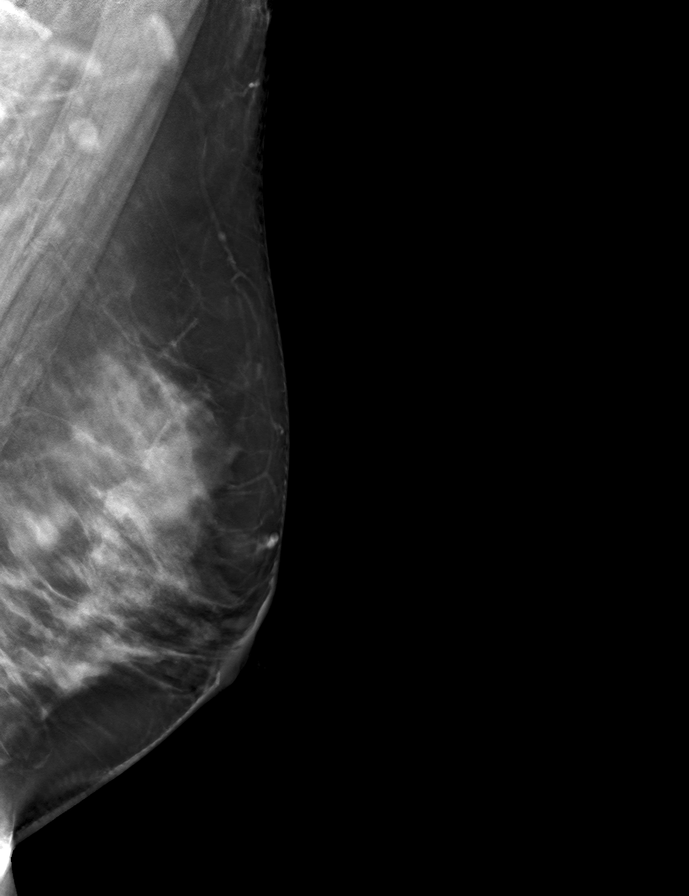

[R MLO tomo · tomo slice 40/79.0]
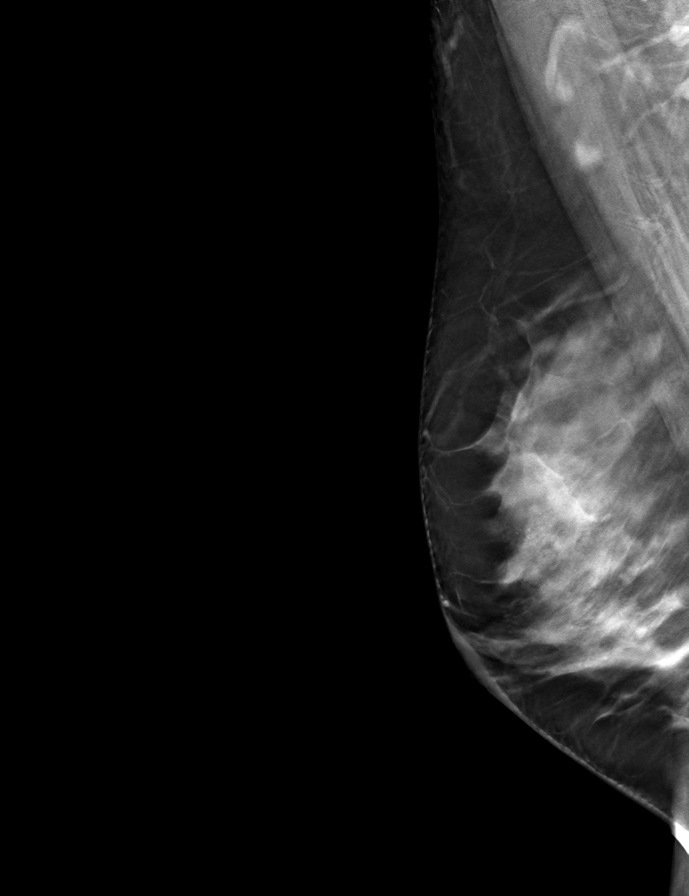

[L CC tomo · tomo slice 39/76.0]
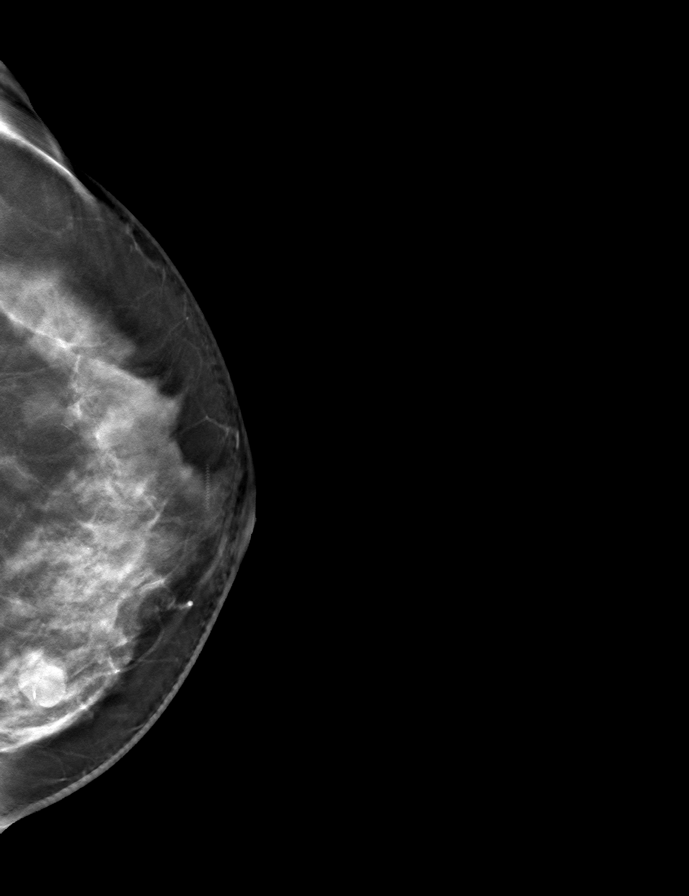

[9 of 24 positions shown; findings below may reference images not displayed]

ACR Breast Density Category c: The breast tissue is heterogeneously
dense, which may obscure small masses.
FINDINGS: There are no findings suspicious for malignancy.
IMPRESSION: No mammographic evidence of malignancy. A result letter of this
screening mammogram will be mailed directly to the patient.

RECOMMENDATION:
Screening mammogram in one year. (Code:Q3-W-BC3)

BI-RADS CATEGORY  1: Negative.

## 2022-05-23 ENCOUNTER — Other Ambulatory Visit: Payer: Self-pay

## 2022-05-23 DIAGNOSIS — N644 Mastodynia: Secondary | ICD-10-CM

## 2022-05-23 DIAGNOSIS — Z1231 Encounter for screening mammogram for malignant neoplasm of breast: Secondary | ICD-10-CM

## 2022-06-30 ENCOUNTER — Ambulatory Visit: Payer: Self-pay | Admitting: Hematology and Oncology

## 2022-06-30 ENCOUNTER — Ambulatory Visit
Admission: RE | Admit: 2022-06-30 | Discharge: 2022-06-30 | Disposition: A | Discharge status: Home / Self Care | Payer: No Typology Code available for payment source | Source: Ambulatory Visit | Attending: Obstetrics and Gynecology | Admitting: Obstetrics and Gynecology

## 2022-06-30 VITALS — BP 125/71 | Wt 126.6 lb

## 2022-06-30 DIAGNOSIS — Z01419 Encounter for gynecological examination (general) (routine) without abnormal findings: Secondary | ICD-10-CM

## 2022-06-30 DIAGNOSIS — Z1231 Encounter for screening mammogram for malignant neoplasm of breast: Secondary | ICD-10-CM

## 2022-06-30 DIAGNOSIS — Z1211 Encounter for screening for malignant neoplasm of colon: Secondary | ICD-10-CM

## 2022-06-30 NOTE — Patient Instructions (Signed)
Taught April Zimmerman about self breast awareness and gave educational materials to take home. Patient did not need a Pap smear today due to last Pap smear was in 2019 per patient. Let her know [facility] will cover Pap smears every 5 years unless has a history of abnormal Pap smears. Referred patient to the [facility] for screening mammogram. Appointment scheduled for 06/30/22. Patient aware of appointment and will be there. Let patient know will follow up with her within the next couple weeks with results. April Zimmerman verbalized understanding.  April Lux, NP 12:00 PM

## 2022-06-30 NOTE — Progress Notes (Signed)
[name] is a 52 y.o. G8P0 female who presents to [facility] clinic today with no complaints.    Pap Smear: Pap smear completed today. Last Pap smear was 2019 and was normal. Per patient has no history of an abnormal Pap smear. Last Pap smear result is available in Epic.   Physical exam: Breasts Breasts symmetrical. No skin abnormalities bilateral breasts. No nipple retraction bilateral breasts. No nipple discharge bilateral breasts. No lymphadenopathy. No lumps palpated bilateral breasts.       Pelvic/Bimanual Ext Genitalia No lesions, no swelling and no discharge observed on external genitalia.        Vagina Vagina pink and normal texture. No lesions or discharge observed in vagina.        Cervix Cervix is present. Cervix pink and of normal texture. No discharge observed.    Uterus Uterus is present and palpable. Uterus in normal position and normal size.        Adnexae Bilateral ovaries present and palpable. No tenderness on palpation.         Rectovaginal No rectal exam completed today since patient had no rectal complaints. No skin abnormalities observed on exam.     Smoking History: Patient has never smoked and was not referred to quit line.    Patient Navigation: Patient education provided. Access to services provided for patient through [facility] program. April Zimmerman interpreter provided. No transportation provided   Colorectal Cancer Screening: Per patient has never had colonoscopy completed No complaints today. FIT test given.    Breast and Cervical Cancer Risk Assessment: Patient does not have family history of breast cancer, known genetic mutations, or radiation treatment to the chest before age 6. Patient does not have history of cervical dysplasia, immunocompromised, or DES exposure in-utero.  Risk Scores as of 06/30/2022     April Zimmerman           5-year 0.9 %   Lifetime 7.42 %   This patient is [place]/Latina but has no documented birth country,  so the [place] model used data from [place] patients to calculate their risk score. Document a birth country in the Demographics activity for a more accurate score.         Last calculated by April Zimmerman, CMA on 06/30/2022 at 11:31 AM        A: [facility] exam with pap smear No complaints with benign exam.   P: Referred patient to the [facility] for a screening mammogram. Appointment scheduled 06/30/22.  April Lux, NP 06/30/2022 11:58 AM

## 2022-07-07 LAB — CYTOLOGY - PAP
Adequacy: ABSENT
Comment: NEGATIVE
Diagnosis: UNDETERMINED — AB
High risk HPV: NEGATIVE

## 2022-07-11 ENCOUNTER — Telehealth: Payer: Self-pay

## 2022-07-11 NOTE — Telephone Encounter (Signed)
-----   Message from Pascal Lux, NP sent at 07/11/2022 11:39 AM EDT ----- Repeat Pap in 3 years.  ----- Message ----- From: Interface, Lab In Three Zero One Sent: 07/07/2022  12:53 PM EDT To: Pascal Lux, NP

## 2022-07-11 NOTE — Telephone Encounter (Signed)
[address] [employer], [place], [place], [place]: [id number], pt has been advised of her PAP results. Pt understands the recommendation is to repeat her PAP in 3 years. Pt expressed understanding of this information.

## 2022-08-03 LAB — FECAL OCCULT BLOOD, IMMUNOCHEMICAL: Fecal Occult Bld: NEGATIVE

## 2022-08-10 ENCOUNTER — Other Ambulatory Visit: Payer: Self-pay

## 2022-08-10 ENCOUNTER — Inpatient Hospital Stay: Payer: No Typology Code available for payment source | Attending: Obstetrics and Gynecology | Admitting: *Deleted

## 2022-08-10 VITALS — BP 116/78 | Ht <= 58 in | Wt 125.4 lb

## 2022-08-10 DIAGNOSIS — Z0000 Encounter for general adult medical examination without abnormal findings: Secondary | ICD-10-CM

## 2022-08-10 NOTE — Progress Notes (Signed)
Wisewoman initial screening   Interpreter- [name], [employer]   Clinical Measurement: There were no vitals filed for this visit. Fasting Labs Drawn Today, will review with patient when they result.   Medical History: Patient states that she  does not know if she has  high cholesterol, does not have high blood pressure and she does not have diabetes.  Medications: Patient states that she does not take medication to lower cholesterol, blood pressure or blood sugar.  Patient does not take an aspirin a day to help prevent a heart attack or stroke.    Blood pressure, self measurement: Patient states that she does measure blood pressure from home. She checks her blood pressure monthly. She shares her readings with a health care provider: no.   Nutrition: Patient states that on average she eats 2 cups of fruit and 3 cups of vegetables per day. Patient states that she does eat fish at least 2 times per week. Patient eats about half servings of whole grains. Patient drinks less than 36 ounces of beverages with added sugar weekly: yes. Patient is currently watching sodium or salt intake: yes. In the past 7 days patient has consumed drinks containing alcohol on 0 days. On a day that patient consumes drinks containing alcohol on average 0 drinks are consumed.      Physical activity: Patient states that she gets 0 minutes of moderate and 0 minutes of vigorous physical activity each week.  Smoking status: Patient states that she has has never smoked .   Quality of life: Over the past 2 weeks patient states that she had little interest or pleasure in doing things: several days. She has been feeling down, depressed or hopeless:several days.   Social Determinants of Health Assessment:   Computer Use: During the last 12 months patient states that she has used any of the following: desktop/laptop, smart phone or tablet/other portable wireless computer: yes.   Internet Use: During the last 12 months, did you  or any member of your household have access to the internet: Yes, by paying a cell phone company or internet service provider.   Food Insecurities: During the last 12 months, where there any times when you were worried that you would run out of food because of a lack of money or other resources: No.   Transportation Barriers: During the last 12 months, have you missed a doctor's appointment because of transportation problems: No.   Childcare Barriers: If you are currently using childcare services, please identify  the type of services you use. (If not using childcare services, please select "Not applicable"): not applicable. During the last 12 months, have you had any barriers to childcare services such as: not applicable.   Housing: What is your housing situation today: I have housing.   Intimate Partner Violence: During the last 12 months, how often did your partner physically hurt you: never. During the last 12 months, how often did your partner insult you or talk down to you: never.  Medication Adherence: During the last 12 months, did you ever forget to take your medicine: not applicable. During the last 12 months, were you careless ar times about taking your medicine: not applicable. During the last 12 months, when you felt better did you sometimes stop taking your medication: not applicable. During the last 12 months, sometimes if you felt worse when you took your medicine did you stop taking it: not applicable.   Risk reduction and counseling:   Health Coaching: Patient consumes daily  recommendations for fruits and vegetables. Patient consumes fish at least twice a week. Patient does consume oatmeal occasionally as well ad whole wheat pasta and brown rice. Patient states that her mother has been hospitalized in [place] for the past few months. She has been traveling back and forth from [place] to [place]. She says that she often has to eat whatever food is available and skips meals at times.  Patient knows that she needs to do better with her diet. She has also not been able to exercise recently but hopes to start back soon.  Goal: Patient did not set SMART goal during initial screening visit. Patient stated that she knows she needs to do better with diet and exercise but has not been able to recently due to her mother being hospitalized. Will check back in with patient during next health coaching session to see if she is ready to set a goal.       Navigation:  I will notify patient of lab results.  Patient is aware of 2 more health coaching sessions and a follow up. Referred patient through [facility] for counseling services.   Time: 25 minutes

## 2022-08-11 LAB — HEMOGLOBIN A1C
Est. average glucose Bld gHb Est-mCnc: 120 mg/dL
Hgb A1c MFr Bld: 5.8 % — ABNORMAL HIGH (ref 4.8–5.6)

## 2022-08-11 LAB — LIPID PANEL
Chol/HDL Ratio: 3.7 ratio (ref 0.0–4.4)
Cholesterol, Total: 224 mg/dL — ABNORMAL HIGH (ref 100–199)
HDL: 60 mg/dL (ref >39)
LDL Chol Calc (NIH): 149 mg/dL — ABNORMAL HIGH (ref 0–99)
Triglycerides: 88 mg/dL (ref 0–149)
VLDL Cholesterol Cal: 15 mg/dL (ref 5–40)

## 2022-08-11 LAB — GLUCOSE, RANDOM: Glucose: 88 mg/dL (ref 70–99)

## 2022-08-16 ENCOUNTER — Telehealth: Payer: Self-pay

## 2022-08-16 NOTE — Telephone Encounter (Signed)
Health coaching 2   interpreter- [name], [employer]   Labs- 224 cholesterol, 149 LDL cholesterol, 88 triglycerides, 60 HDL cholesterol, 5.8 hemoglobin A1C, 88 mean plasma glucose. Patient understands and is aware of her lab results.   Goals-  1. Reduce the amount of fried and fatty foods consumed. 2. Substitute for lean proteins like chicken and fish instead of red meats. 3. Daily exercise for 20-30 minutes 4. Continue watching the amount of sweet and sugary foods and drinks consumed as well as carbs.   Navigation:  Patient is aware of 1 more health coaching sessions and a follow up. Patient is scheduled for FU with PCE on August 24, 2022 @ 9:40 am for elevated labs.  Time- 10 minutes

## 2022-08-22 NOTE — Progress Notes (Unsigned)
Patient ID: April Zimmerman, female    DOB: Oct 04, 1970  MRN: [id number]  CC: Follow-Up  Subjective: April Zimmerman is a 52 y.o. female who presents for follow-up.   Her concerns today include:  Patient had recent lipid panel and hemoglobin A1c obtained on 08/10/2022 during visit at [facility]. Lipid panel resulted above goal and hemoglobin A1c was in prediabetes range at 5.8%. patient was referred to Primary Care for follow-up. Today patient reports she has not been monitoring what she eats and has not been exercising due to family stressors. Reports her mother recently passed away a couple days ago. Reports she does not want to trial medication for high cholesterol or stress. States she plans to begin monitoring what she eats and exercising. She declines referral to therapist. Reports she has family for emotional support. Patient denies thoughts of self-harm, suicidal ideations, homicidal ideations. Patient reports she lost her [employer] application and needs a new one on today. No further issues/concerns for discussion today.    Patient Active Problem List   Diagnosis Date Noted   Prediabetes 08/24/2022   Screening breast examination 04/16/2019   Gastroesophageal reflux disease 12/12/2017     Current Outpatient Medications on File Prior to Visit  Medication Sig Dispense Refill   cholecalciferol (VITAMIN D3) 25 MCG (1000 UNIT) tablet Take 1,000 Units by mouth daily.     omega-3 acid ethyl esters (LOVAZA) 1 g capsule Take by mouth daily.     dicyclomine (BENTYL) 20 MG tablet Take 1 tablet by mouth 4 (four) times daily as needed. (Patient not taking: Reported on 07/29/2021)  1   omeprazole (PRILOSEC) 20 MG capsule Take 20 mg by mouth daily. PRN only (Patient not taking: Reported on 07/29/2021)     sucralfate (CARAFATE) 1 g tablet Take 1 tablet by mouth 2 (two) times daily. (Patient not taking: Reported on 07/29/2021)  1   triamcinolone cream (KENALOG) 0.1 % Apply 1  application topically 2 (two) times daily as needed. (Patient not taking: Reported on 04/05/2018) 90 g 0   No current facility-administered medications on file prior to visit.    No Known Allergies  Social History   Socioeconomic History   Marital status: Married    Spouse name: Not on file   Number of children: 7   Years of education: Not on file   Highest education level: 8th grade  Occupational History   Not on file  Tobacco Use   Smoking status: Never   Smokeless tobacco: Never  Vaping Use   Vaping Use: Never used  Substance and Sexual Activity   Alcohol use: Never   Drug use: Never   Sexual activity: Yes    Birth control/protection: Surgical  Other Topics Concern   Not on file  Social History Narrative   ** Merged History Encounter **       Social Determinants of Health   Financial Resource Strain: Not on file  Food Insecurity: No Food Insecurity (06/30/2022)   Hunger Vital Sign    Worried About Running Out of Food in the Last Year: Never true    Ran Out of Food in the Last Year: Never true  Transportation Needs: No Transportation Needs (06/30/2022)   PRAPARE - Administrator, Civil Service (Medical): No    Lack of Transportation (Non-Medical): No  Physical Activity: Not on file  Stress: Not on file  Social Connections: Not on file  Intimate Partner Violence: Not on file  Family History  Problem Relation Age of Onset   Diabetes Mother     Past Surgical History:  Procedure Laterality Date   BREAST BIOPSY Left 06/23/2020   fibroadenoma   TUBAL LIGATION      ROS: Review of Systems Negative except as stated above  PHYSICAL EXAM: BP 131/81 (BP Location: Left Arm, Patient Position: Sitting, Cuff Size: Normal)   Pulse 65   Temp 98 F (36.7 C)   Resp 12   Ht 4\' 11"  (1.499 m)   Wt 125 lb (56.7 kg)   SpO2 99%   BMI 25.25 kg/m   Physical Exam HENT:     Head: Normocephalic and atraumatic.     Nose: Nose normal.     Mouth/Throat:      Mouth: Mucous membranes are moist.     Pharynx: Oropharynx is clear.  Eyes:     Extraocular Movements: Extraocular movements intact.     Conjunctiva/sclera: Conjunctivae normal.     Pupils: Pupils are equal, round, and reactive to light.  Cardiovascular:     Rate and Rhythm: Normal rate and regular rhythm.     Pulses: Normal pulses.     Heart sounds: Normal heart sounds.  Pulmonary:     Effort: Pulmonary effort is normal.     Breath sounds: Normal breath sounds.  Musculoskeletal:     Cervical back: Normal range of motion and neck supple.  Neurological:     General: No focal deficit present.     Mental Status: She is alert and oriented to person, place, and time.  Psychiatric:        Mood and Affect: Affect is tearful.     ASSESSMENT AND PLAN: 1. Prediabetes - Hemoglobin A1c 5.8% on 08/10/2022 and consistent with prediabetes.  - Discussed the importance of healthy eating habits, low-carbohydrate diet, low-sugar diet, and regular aerobic exercise (at least 150 minutes a week as tolerated) to achieve or maintain control of prediabetes. - Follow-up with primary provider in November 2024 or sooner if needed for prediabetes recheck.  2. Hyperlipidemia, unspecified hyperlipidemia type - Practice low-fat heart healthy diet and at least 150 minutes of moderate intensity exercise weekly as tolerated.  - Patient declined pharmacological therapy.  - Follow-up with primary provider in 4 weeks or sooner if needed.   3. Stress - Patient denies thoughts of self-harm, suicidal ideations, homicidal ideations. - Patient declined pharmacological therapy.  - Patient declined referral to Psychiatry. - Follow-up with primary provider as scheduled.   4. Financial difficulties - Offered patient [employer] financial discount/orange card. Discussed patient will need to mail in completed application for processing and schedule appointment with financial counselor. Patient verbalized  understanding.  5. Language barrier - [employer]. Name: Zettie Pho  ID#: [id number]    Patient was given the opportunity to ask questions.  Patient verbalized understanding of the plan and was able to repeat key elements of the plan. Patient was given clear instructions to go to Emergency Department or return to medical center if symptoms don't improve, worsen, or new problems develop.The patient verbalized understanding.    Return in about 4 weeks (around 09/21/2022) for Follow-Up or next available chronic care mgmt .  Rema Fendt, NP

## 2022-08-24 ENCOUNTER — Encounter: Payer: Self-pay | Admitting: Family

## 2022-08-24 ENCOUNTER — Ambulatory Visit (INDEPENDENT_AMBULATORY_CARE_PROVIDER_SITE_OTHER): Payer: Self-pay | Admitting: Family

## 2022-08-24 VITALS — BP 131/81 | HR 65 | Temp 98.0°F | Resp 12 | Ht 59.0 in | Wt 125.0 lb

## 2022-08-24 DIAGNOSIS — Z758 Other problems related to medical facilities and other health care: Secondary | ICD-10-CM

## 2022-08-24 DIAGNOSIS — E785 Hyperlipidemia, unspecified: Secondary | ICD-10-CM

## 2022-08-24 DIAGNOSIS — R7303 Prediabetes: Secondary | ICD-10-CM

## 2022-08-24 DIAGNOSIS — Z599 Problem related to housing and economic circumstances, unspecified: Secondary | ICD-10-CM

## 2022-08-24 DIAGNOSIS — Z603 Acculturation difficulty: Secondary | ICD-10-CM

## 2022-08-24 DIAGNOSIS — F439 Reaction to severe stress, unspecified: Secondary | ICD-10-CM

## 2022-08-24 NOTE — Progress Notes (Signed)
Pt is here for follow up   Elevated labs and advised pt to f/u with PCP

## 2022-09-21 ENCOUNTER — Ambulatory Visit: Payer: No Typology Code available for payment source | Admitting: Family

## 2023-03-30 ENCOUNTER — Telehealth: Payer: Self-pay

## 2023-03-30 NOTE — Telephone Encounter (Signed)
Health Coaching 3  interpreter- [name], [facility]   Goals- Patient has been doing better handling the stressors in her life since speaking with her last time. She has been trying to take care of herself better now that her mother has passed. She has been trying to eat more regular healthier meals at home since she is not having to travel as much. Has been watching the amount of sweet and sugary foods and drinks that she consumes.    New goal-   Barrier to reaching goal-    Strategies to overcome-    Navigation:  Patient is aware of  a follow up session. Patient is scheduled for FU on 05/01/23.   Time- 10 minutes

## 2023-05-01 ENCOUNTER — Inpatient Hospital Stay: Payer: No Typology Code available for payment source | Attending: Obstetrics and Gynecology | Admitting: *Deleted

## 2023-05-01 ENCOUNTER — Other Ambulatory Visit: Payer: Self-pay

## 2023-05-01 VITALS — BP 129/74 | Ht <= 58 in | Wt 128.7 lb

## 2023-05-01 DIAGNOSIS — Z1231 Encounter for screening mammogram for malignant neoplasm of breast: Secondary | ICD-10-CM

## 2023-05-01 DIAGNOSIS — Z0000 Encounter for general adult medical examination without abnormal findings: Secondary | ICD-10-CM

## 2023-05-01 NOTE — Progress Notes (Signed)
Wisewoman follow up   Interpreter: [name], [employer]  Clinical Measurement:   Vitals:   05/01/23 1051 05/01/23 1216  BP: (!) 125/58 129/74      Medical History: Patient states that she  does not know if she has  high cholesterol, does not have high blood pressure and she does not have diabetes.    Medications: Patient states that she does not take medication to lower cholesterol, blood pressure and blood sugar.  Patient does not take an aspirin a day to help prevent a heart attack or stroke.   Blood pressure, self measurement: Patient states that she does measure blood pressure from home. She checks her blood pressure monthly. She shares her readings with a health care provider: no.   Nutrition: Patient states that on average she eats 3 cups of fruit and 3 cups of vegetables per day. Patient states that she does not eat fish at least 2 times per week. Patient eats about half servings of whole grains. Patient drinks less than 36 ounces of beverages with added sugar weekly: yes. Patient is currently watching sodium or salt intake: yes. In the past 7 days patient has had 0 drinks containing alcohol. On average patient drinks 0 drinks containing alcohol per day.      Physical activity: Patient states that she gets 140 minutes of moderate and 0 minutes of vigorous physical activity each week.  Smoking status: Patient states that she has has never smoked .   Quality of life: Over the past 2 weeks patient states that she had little interest or pleasure in doing things: not at all. She has been feeling down, depressed or hopeless:several days.   Social Determinants of Health Assessment:   Computer Use: During the last 12 months patient states that she has used any of the following: desktop/laptop, smart phone or tablet/other portable wireless computer: yes.   Internet Use: During the last 12 months, did you or any member of your household have access to the internet: Yes, by paying a cell  phone company or internet service provider.   Food Insecurities: During the last 12 months, where there any times when you were worried that you would run out of food because of a lack of money or other resources: No.   Transportation Barriers: During the last 12 months, have you missed a doctor's appointment because of transportation problems: No.   Childcare Barriers: If you are currently using childcare services, please identify  the type of services you use. (If not using childcare services, please select "Not applicable"): not applicable. During the last 12 months, have you had any barriers to childcare services such as: not applicable.   Housing: What is your housing situation today: I have housing.   Intimate Partner Violence: During the last 12 months, how often did your partner physically hurt you: never. During the last 12 months, how often did your partner insult you or talk down to you: never.  Medication Adherence: During the last 12 months, did you ever forget to take your medicine: not applicable. During the last 12 months, were you careless ar times about taking your medicine: not applicable. During the last 12 months, when you felt better did you sometimes stop taking your medication: not applicable. During the last 12 months, sometimes if you felt worse when you took your medicine did you stop taking it: not applicable.    Risk reduction and counseling: Encouraged patient to continue to practice a heart healthy diet. Encouraged patient to  watch the amount of fried and fatty foods consumed. Gave suggestions for heart healthy fish that she can try adding into her weekly diet. Patient has been consuming a variety of whole grains. Patient states that she walks everyday around 20-30 minutes. Encouraged her to keep up with the daily exercise.     Navigation: This was the  follow up session for this patient, I will check up on her progress in the coming months.  Time: 25 minutes

## 2023-06-27 ENCOUNTER — Ambulatory Visit: Admission: RE | Admit: 2023-06-27 | Payer: No Typology Code available for payment source | Source: Ambulatory Visit

## 2023-06-27 ENCOUNTER — Ambulatory Visit: Payer: Self-pay | Admitting: Hematology and Oncology

## 2023-06-27 VITALS — BP 134/63 | Wt 131.0 lb

## 2023-06-27 DIAGNOSIS — Z1231 Encounter for screening mammogram for malignant neoplasm of breast: Secondary | ICD-10-CM

## 2023-06-27 DIAGNOSIS — Z1211 Encounter for screening for malignant neoplasm of colon: Secondary | ICD-10-CM

## 2023-06-27 NOTE — Patient Instructions (Signed)
 Taught April Zimmerman about self breast awareness and gave educational materials to take home. Patient did not need a Pap smear today due to last Pap smear was in 06/27/2023 per patient. Told patient about free cervical cancer screenings to receive a Pap smear if would like one next year. Let her know [facility] will cover Pap smears every 5 years unless has a history of abnormal Pap smears. Referred patient to the [facility] for screening mammogram. Appointment scheduled for 06/27/2023. Patient aware of appointment and will be there. Let patient know will follow up with her within the next couple weeks with results. April Zimmerman verbalized understanding.  Adelaide Adjutant, NP 10:47 AM

## 2023-06-27 NOTE — Progress Notes (Unsigned)
 [name] is a 53 y.o. female who presents to [facility] clinic today with no complaints.    Pap Smear: Pap not smear completed today. Last Pap smear was 06/30/2022 and was abnormal - ASCUS . Per patient has no history of an abnormal Pap smear. Last Pap smear result is available in Epic.   Physical exam: Breasts Breasts symmetrical. No skin abnormalities bilateral breasts. No nipple retraction bilateral breasts. No nipple discharge bilateral breasts. No lymphadenopathy. No lumps palpated bilateral breasts.      MS 3D SCR MAMMO BILAT BR (aka MM) Result Date: 07/04/2022 CLINICAL DATA:  Screening. EXAM: DIGITAL SCREENING BILATERAL MAMMOGRAM WITH TOMOSYNTHESIS AND CAD TECHNIQUE: Bilateral screening digital craniocaudal and mediolateral oblique mammograms were obtained. Bilateral screening digital breast tomosynthesis was performed. The images were evaluated with computer-aided detection. COMPARISON:  Previous exam(s). ACR Breast Density Category c: The breasts are heterogeneously dense, which may obscure small masses. FINDINGS: There are no findings suspicious for malignancy. IMPRESSION: No mammographic evidence of malignancy. A result letter of this screening mammogram will be mailed directly to the patient. RECOMMENDATION: Screening mammogram in one year. (Code:SM-B-01Y) BI-RADS CATEGORY  1: Negative. Electronically Signed   By: Jacob Moores M.D.   On: 07/04/2022 09:18   MS DIGITAL SCREENING TOMO BILATERAL Result Date: 06/16/2021 CLINICAL DATA:  Screening. EXAM: DIGITAL SCREENING BILATERAL MAMMOGRAM WITH TOMOSYNTHESIS AND CAD TECHNIQUE: Bilateral screening digital craniocaudal and mediolateral oblique mammograms were obtained. Bilateral screening digital breast tomosynthesis was performed. The images were evaluated with computer-aided detection. COMPARISON:  Previous exam(s). ACR Breast Density Category c: The breast tissue is heterogeneously dense, which may obscure small masses. FINDINGS:  There are no findings suspicious for malignancy. IMPRESSION: No mammographic evidence of malignancy. A result letter of this screening mammogram will be mailed directly to the patient. RECOMMENDATION: Screening mammogram in one year. (Code:SM-B-01Y) BI-RADS CATEGORY  1: Negative. Electronically Signed   By: Romona Curls M.D.   On: 06/16/2021 12:54   MM CLIP PLACEMENT LEFT Result Date: 06/23/2020 CLINICAL DATA:  53 year old female status post ultrasound biopsy of the left breast. EXAM: DIAGNOSTIC LEFT MAMMOGRAM POST ULTRASOUND BIOPSY COMPARISON:  Previous exam(s). FINDINGS: Mammographic images were obtained following ultrasound guided biopsy of the left breast. The biopsy marking clip is in expected position at the site of biopsy. IMPRESSION: Appropriate positioning of the ribbon shaped biopsy marking clip at the site of biopsy in the upper inner left breast. Final Assessment: Post Procedure Mammograms for Marker Placement Electronically Signed   By: Sande Brothers M.D.   On: 06/23/2020 13:34   MS DIGITAL DIAG TOMO BILAT Result Date: 06/18/2020 CLINICAL DATA:  53 year old female for further evaluation of possible RIGHT breast distortion and possible LEFT breast mass on screening mammogram. EXAM: DIGITAL DIAGNOSTIC BILATERAL MAMMOGRAM WITH TOMOSYNTHESIS AND CAD; ULTRASOUND LEFT BREAST LIMITED TECHNIQUE: Bilateral digital diagnostic mammography and breast tomosynthesis was performed. The images were evaluated with computer-aided detection.; Targeted ultrasound examination of the left breast was performed COMPARISON:  Previous exam(s). ACR Breast Density Category c: The breast tissue is heterogeneously dense, which may obscure small masses. FINDINGS: 2D/3D full field and spot compression views of both breasts demonstrate no persistent distortion or suspicious abnormality at the site of the RIGHT breast screening study finding. A persistent circumscribed oval mass within the UPPER INNER LEFT breast is identified.  Targeted ultrasound is performed, showing a 1.7 x 0.7 x 1.4 cm circumscribed oval hypoechoic parallel mass at the 10 o'clock position of the LEFT breast 5 cm from  the nipple. No abnormal LEFT axillary lymph nodes are identified. IMPRESSION: 1. 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 We discussed management options including excision, ultrasound-guided core biopsy, and close follow-up. The patient desires tissue sampling. 2. No persistent distortion or suspicious abnormality at the site of the RIGHT breast screening study finding. RECOMMENDATION: Ultrasound-guided LEFT breast biopsy, which will be scheduled. I have discussed the findings and recommendations with the patient. If applicable, a reminder letter will be sent to the patient regarding the next appointment. BI-RADS CATEGORY  3: Probably benign. Electronically Signed   By: Sundra Engel M.D.   On: 06/18/2020 16:08   MS DIGITAL SCREENING TOMO BILATERAL Result Date: 05/29/2020 CLINICAL DATA:  Screening. EXAM: DIGITAL SCREENING BILATERAL MAMMOGRAM WITH TOMOSYNTHESIS AND CAD TECHNIQUE: Bilateral screening digital craniocaudal and mediolateral oblique mammograms were obtained. Bilateral screening digital breast tomosynthesis was performed. The images were evaluated with computer-aided detection. COMPARISON:  Previous exam(s). ACR Breast Density Category d: The breast tissue is extremely dense, which lowers the sensitivity of mammography. FINDINGS: In the right breast a possible distortion requires further evaluation. In the left breast a possible mass requires further evaluation. IMPRESSION: Further evaluation is suggested for possible distortion in the right breast. Further evaluation is suggested for a possible mass in the left breast. RECOMMENDATION: Diagnostic mammogram and possibly ultrasound of both breasts. (Code:[id number]-52M) The patient will be contacted regarding the findings, and additional imaging will be scheduled. BI-RADS  CATEGORY  0: Incomplete. Need additional imaging evaluation and/or prior mammograms for comparison. Electronically Signed   By: Alinda Apley M.D.   On: 05/29/2020 11:52   MS DIGITAL SCREENING TOMO BILATERAL Result Date: 04/17/2019 CLINICAL DATA:  Screening. EXAM: DIGITAL SCREENING BILATERAL MAMMOGRAM WITH TOMO AND CAD COMPARISON:  Previous exam(s). ACR Breast Density Category d: The breast tissue is extremely dense, which lowers the sensitivity of mammography FINDINGS: There are no findings suspicious for malignancy. Images were processed with CAD. IMPRESSION: No mammographic evidence of malignancy. A result letter of this screening mammogram will be mailed directly to the patient. RECOMMENDATION: Screening mammogram in one year. (Code:SM-B-01Y) BI-RADS CATEGORY  1: Negative. Electronically Signed   By: Serena  Chacko M.D.   On: 04/17/2019 08:27    Pelvic/Bimanual Pap is not indicated today    Smoking History: Patient has never smoked and was not referred to quit line.    Patient Navigation: Patient education provided. Access to services provided for patient through [facility] program. Herma Longest interpreter provided. No transportation provided   Colorectal Cancer Screening: Per patient has never had colonoscopy completed No complaints today.    Breast and Cervical Cancer Risk Assessment: Patient does not have family history of breast cancer, known genetic mutations, or radiation treatment to the chest before age 17. Patient does not have history of cervical dysplasia, immunocompromised, or DES exposure in-utero.  Risk Scores as of Encounter on 06/27/2023     Gregary Lean           5-year 0.93%   Lifetime 7.29%   This patient is [place]/Lat[place] but has no documented birth country, so the [place] model used data from [place] patients to calculate their risk score. Document a birth country in the Demographics activity for a more accurate score.         Last calculated by Silas, Ansyi K, CMA on  06/27/2023 at 10:37 AM        A: [facility] exam without pap smear No complaints with benign exam.   P: Referred patient  to the [facility] for a screening mammogram. Appointment scheduled 06/27/2023.  Baldomero Bone A, NP 06/27/2023 10:45 AM

## 2023-07-06 ENCOUNTER — Ambulatory Visit
Admission: RE | Admit: 2023-07-06 | Discharge: 2023-07-06 | Disposition: A | Discharge status: Home / Self Care | Source: Ambulatory Visit | Attending: Obstetrics and Gynecology | Admitting: Obstetrics and Gynecology

## 2023-07-06 DIAGNOSIS — Z1231 Encounter for screening mammogram for malignant neoplasm of breast: Secondary | ICD-10-CM

## 2023-08-11 ENCOUNTER — Ambulatory Visit: Payer: Self-pay

## 2023-08-11 LAB — FECAL OCCULT BLOOD, IMMUNOCHEMICAL: Fecal Occult Bld: NEGATIVE

## 2023-08-25 NOTE — Progress Notes (Unsigned)
 [facility] Re-screening   Interpreter- Herma Longest, [place]   Clinical Measurement:  Vitals:   08/28/23 1005 08/28/23 1022  BP: 121/76 124/70   Fasting Labs Drawn Today, will review with patient when they result.   Medical History: Patient states that she has high cholesterol, does not have high blood pressure and she does not have diabetes. Patient states that she does not have history of gestational hypertension, does not have history of pre-eclampsia/eclampsia and she does not have history of gestational diabetes.    Medications: Patient states that she does not take medication to lower cholesterol, blood pressure or blood sugar.  Patient does not take an aspirin a day to help prevent a heart attack or stroke.    Blood pressure, self measurement: Patient states that she does measure blood pressure from home. She checks her blood pressure monthly. She shares her readings with a health care provider: no.   Nutrition: Patient states that on average she eats 2 cups of fruit and 2 cups of vegetables per day. Patient states that she does not eat fish at least 2 times per week. Patient eats less than half servings of whole grains. Patient drinks less than 36 ounces of beverages with added sugar weekly: yes. Patient is currently watching sodium or salt intake: yes. In the past 7 days patient has consumed drinks containing alcohol on 0 days. On a day that patient consumes drinks containing alcohol on average 0 drinks are consumed.      Physical activity: Patient states that she gets 140 minutes of moderate and 0 minutes of vigorous physical activity each week.  Smoking status: Patient states that she has has never smoked .   Quality of life: Over the past 2 weeks patient states that she had little interest or pleasure in doing things: several days. She has been feeling down, depressed or hopeless:several days.   Social Determinants of Health Assessment:   Computer Use: During the last 12 months  patient states that she has used any of the following: desktop/laptop, smart phone or tablet/other portable wireless computer: yes.   Internet Use: During the last 12 months, did you or any member of your household have access to the internet: Yes, by paying a cell phone company or internet service provider.   Food Insecurities: During the last 12 months, where there any times when you were worried that you would run out of food because of a lack of money or other resources: No.   Transportation Barriers: During the last 12 months, have you missed a doctor's appointment because of transportation problems: No.   Childcare Barriers: If you are currently using childcare services, please identify  the type of services you use. (If not using childcare services, please select Not applicable): not applicable. During the last 12 months, have you had any barriers to childcare services such as: not applicable.   Housing: What is your housing situation today: I have housing.   Intimate Partner Violence: During the last 12 months, how often did your partner physically hurt you: never. During the last 12 months, how often did your partner insult you or talk down to you: never.  Medication Adherence: During the last 12 months, did you ever forget to take your medicine: not applicable. During the last 12 months, were you careless ar times about taking your medicine: not applicable. During the last 12 months, when you felt better did you sometimes stop taking your medication: not applicable. During the last 12 months, sometimes if  you felt worse when you took your medicine did you stop taking it: not applicable.   Risk reduction and counseling:   Health Coaching:Spoke with patient about practicing a well balanced diet. Patient has been doing intermittent fasting. Patient states that she has been feeling more tired and having less energy recently as well as dizziness. Encouraged patient to try and start eating 3  meals a day instead of one large meal to see if she feels any better. Let her know that it may take some time before she notices any changes. Patient has been walking daily and has recently joined a gym. Patient has not been drinking enough water recently. Encouraged patient to try and drink at least 64 ounces of water daily. Gave patient a 32 ounce water bottle to take home. Gave the suggestion of filling the water bottle up in the morning when she wakes up and try to drink the whole bottle before lunch time and then fill the bottle up again to drink in the PM.  Goal: Patient will increase daily water intake. Patient will try and consume 64 ounces of water daily. Patient will work on reaching this goal over the next month.   Navigation:  I will notify patient of lab results.  Patient is aware of 2 more health coaching sessions and a follow up. Will refer patient to [employer] program for counseling services.

## 2023-08-28 ENCOUNTER — Other Ambulatory Visit: Payer: Self-pay

## 2023-08-28 ENCOUNTER — Inpatient Hospital Stay: Payer: Self-pay | Attending: Obstetrics and Gynecology | Admitting: *Deleted

## 2023-08-28 VITALS — BP 124/70 | Ht <= 58 in | Wt 131.7 lb

## 2023-08-28 DIAGNOSIS — Z0000 Encounter for general adult medical examination without abnormal findings: Secondary | ICD-10-CM

## 2023-08-29 LAB — GLUCOSE, RANDOM: Glucose: 88 mg/dL (ref 70–99)

## 2023-08-29 LAB — LIPID PANEL
Chol/HDL Ratio: 4.5 ratio — ABNORMAL HIGH (ref 0.0–4.4)
Cholesterol, Total: 216 mg/dL — ABNORMAL HIGH (ref 100–199)
HDL: 48 mg/dL (ref >39)
LDL Chol Calc (NIH): 138 mg/dL — ABNORMAL HIGH (ref 0–99)
Triglycerides: 167 mg/dL — ABNORMAL HIGH (ref 0–149)
VLDL Cholesterol Cal: 30 mg/dL (ref 5–40)

## 2023-08-29 LAB — HEMOGLOBIN A1C
Est. average glucose Bld gHb Est-mCnc: 123 mg/dL
Hgb A1c MFr Bld: 5.9 % — ABNORMAL HIGH (ref 4.8–5.6)

## 2023-09-04 ENCOUNTER — Other Ambulatory Visit

## 2023-09-04 ENCOUNTER — Ambulatory Visit

## 2023-09-06 ENCOUNTER — Ambulatory Visit: Payer: Self-pay

## 2023-09-06 ENCOUNTER — Telehealth: Payer: Self-pay

## 2023-09-06 NOTE — Telephone Encounter (Signed)
Left message for patient via [name], [name] about lab results from [facility]. Left name and number for patient to call back.

## 2023-09-06 NOTE — Telephone Encounter (Signed)
 Health coaching 2   interpreter- Bernice Angry, [employer]   Labs- 216 cholesterol, 138 LDL cholesterol, 167 triglycerides, 48 HDL cholesterol, 5.9 hemoglobin A1C, 88 mean plasma glucose. Patient understands and is aware of her lab results.   Goals-  1. Reduce the amount of fried and fatty foods consumed. Try to grill, bake, broil, sautee or use air fryer instead. 2. Reduce the amount of red meats consumed. Increase lean proteins like chicken, fish or [place] instead.  3. Look for low-fat or reduced-fat dairy options. 4. Watch the amount of sweet and sugary foods and drinks consumed.  5. Watch the amount of carb rich foods consumed. 6. Daily exercise for 20-30 minutes   Navigation:  Patient is aware of 1 more health coaching sessions and a follow up. Patient is scheduled with [facility] on October 05, 2023 @ 10:40 am for FU for elevated labs.

## 2023-10-05 ENCOUNTER — Encounter: Payer: Self-pay | Admitting: Family

## 2023-10-05 ENCOUNTER — Ambulatory Visit (INDEPENDENT_AMBULATORY_CARE_PROVIDER_SITE_OTHER): Payer: Self-pay | Admitting: Family

## 2023-10-05 VITALS — BP 134/88 | HR 78 | Temp 98.0°F | Resp 16 | Ht 59.0 in | Wt 131.4 lb

## 2023-10-05 DIAGNOSIS — M79645 Pain in left finger(s): Secondary | ICD-10-CM

## 2023-10-05 DIAGNOSIS — Z758 Other problems related to medical facilities and other health care: Secondary | ICD-10-CM

## 2023-10-05 DIAGNOSIS — Z13220 Encounter for screening for lipoid disorders: Secondary | ICD-10-CM

## 2023-10-05 DIAGNOSIS — Z603 Acculturation difficulty: Secondary | ICD-10-CM

## 2023-10-05 NOTE — Progress Notes (Signed)
 Patient ID: April Zimmerman, female    DOB: 21-Jan-1971  MRN: [id number]  CC: Follow-Up  Subjective: April Zimmerman is a 53 y.o. female who presents for follow-up.   Her concerns today include:  - Cholesterol lab. - Left thumb pain for 2 weeks. Denies recent trauma/injury and red flag symptoms. States when she bends left thumb it becomes stuck and she has to manually reposition. Reports decreased grip strength of left hand. She is wearing a hand brace to help. - Anxiety depression. She declines pharmacological therapy. She is established with a therapist. She denies thoughts of self-harm, suicidal ideations, homicidal ideations.  Patient Active Problem List   Diagnosis Date Noted   Prediabetes 08/24/2022   Screening breast examination 04/16/2019   Gastroesophageal reflux disease 12/12/2017     Current Outpatient Medications on File Prior to Visit  Medication Sig Dispense Refill   Magnesium Glycinate 100 MG CAPS Take 240 mg by mouth daily.     cholecalciferol (VITAMIN D3) 25 MCG (1000 UNIT) tablet Take 1,000 Units by mouth daily. (Patient not taking: Reported on 10/05/2023)     omega-3 acid ethyl esters (LOVAZA) 1 g capsule Take by mouth daily. (Patient not taking: Reported on 06/27/2023)     No current facility-administered medications on file prior to visit.    No Known Allergies  Social History   Socioeconomic History   Marital status: Married    Spouse name: Not on file   Number of children: 7   Years of education: Not on file   Highest education level: 8th grade  Occupational History   Not on file  Tobacco Use   Smoking status: Never   Smokeless tobacco: Never  Vaping Use   Vaping status: Never Used  Substance and Sexual Activity   Alcohol use: Never   Drug use: Never   Sexual activity: Yes    Birth control/protection: Surgical  Other Topics Concern   Not on file  Social History Narrative   ** Merged History Encounter **       Social Drivers  of Health   Financial Resource Strain: Low Risk  (10/05/2023)   Overall Financial Resource Strain (CARDIA)    Difficulty of Paying Living Expenses: Not hard at all  Food Insecurity: No Food Insecurity (06/27/2023)   Hunger Vital Sign    Worried About Running Out of Food in the Last Year: Never true    Ran Out of Food in the Last Year: Never true  Transportation Needs: No Transportation Needs (06/27/2023)   PRAPARE - Administrator, Civil Service (Medical): No    Lack of Transportation (Non-Medical): No  Physical Activity: Sufficiently Active (10/05/2023)   Exercise Vital Sign    Days of Exercise per Week: 7 days    Minutes of Exercise per Session: 30 min  Stress: No Stress Concern Present (10/05/2023)   [employer] of Occupational Health - Occupational Stress Questionnaire    Feeling of Stress: Only a little  Social Connections: Moderately Integrated (10/05/2023)   Social Connection and Isolation Panel    Frequency of Communication with Friends and Family: More than three times a week    Frequency of Social Gatherings with Friends and Family: More than three times a week    Attends Religious Services: More than 4 times per year    Active Member of [employer] or Organizations: No    Attends Banker Meetings: Never    Marital Status: Married  Catering manager Violence:  Not At Risk (10/05/2023)   Humiliation, Afraid, Rape, and Kick questionnaire    Fear of Current or Ex-Partner: No    Emotionally Abused: No    Physically Abused: No    Sexually Abused: No    Family History  Problem Relation Age of Onset   Diabetes Mother    Breast cancer Neg Hx     Past Surgical History:  Procedure Laterality Date   BREAST BIOPSY Left 06/23/2020   fibroadenoma   TUBAL LIGATION      ROS: Review of Systems Negative except as stated above  PHYSICAL EXAM: BP 134/88   Pulse 78   Temp 98 F (36.7 C) (Oral)   Resp 16   Ht 4' 11 (1.499 m)   Wt 131 lb 6.4 oz (59.6 kg)    LMP 08/24/2023 (Approximate)   SpO2 97%   BMI 26.54 kg/m   Physical Exam HENT:     Head: Normocephalic and atraumatic.     Nose: Nose normal.     Mouth/Throat:     Mouth: Mucous membranes are moist.     Pharynx: Oropharynx is clear.  Eyes:     Extraocular Movements: Extraocular movements intact.     Conjunctiva/sclera: Conjunctivae normal.     Pupils: Pupils are equal, round, and reactive to light.  Cardiovascular:     Rate and Rhythm: Normal rate and regular rhythm.     Pulses: Normal pulses.     Heart sounds: Normal heart sounds.  Pulmonary:     Effort: Pulmonary effort is normal.     Breath sounds: Normal breath sounds.  Musculoskeletal:        General: Normal range of motion.     Right shoulder: Normal.     Left shoulder: Normal.     Right upper arm: Normal.     Left upper arm: Normal.     Right elbow: Normal.     Left elbow: Normal.     Right forearm: Normal.     Left forearm: Normal.     Right wrist: Normal.     Left wrist: Normal.     Right hand: Normal.     Left hand: Normal.     Cervical back: Normal range of motion and neck supple.     Comments: Left hand brace.  Neurological:     General: No focal deficit present.     Mental Status: She is alert and oriented to person, place, and time.  Psychiatric:        Mood and Affect: Mood normal.        Behavior: Behavior normal.     ASSESSMENT AND PLAN: 1. Screening cholesterol level (Primary) - Routine screening.  - Lipid panel  2. Pain of left thumb - Patient declined pharmacological therapy.  - Referral to Orthopedic Surgery for evaluation/management.  - Follow-up with primary provider as scheduled. - Ambulatory referral to Orthopedic Surgery  3. Language barrier - Patient speaking English.   Patient was given the opportunity to ask questions.  Patient verbalized understanding of the plan and was able to repeat key elements of the plan. Patient was given clear instructions to go to Emergency  Department or return to medical center if symptoms don't improve, worsen, or new problems develop.The patient verbalized understanding.   Orders Placed This Encounter  Procedures   Lipid panel   Ambulatory referral to Orthopedic Surgery   Follow-up with primary provider as scheduled.  Greig JINNY Drones, NP

## 2023-10-05 NOTE — Progress Notes (Signed)
 Patient scored a 9 on the GAD-7, patient has  pain in her left thumb

## 2023-10-06 ENCOUNTER — Ambulatory Visit: Payer: Self-pay | Admitting: Family

## 2023-10-06 DIAGNOSIS — E785 Hyperlipidemia, unspecified: Secondary | ICD-10-CM

## 2023-10-06 LAB — LIPID PANEL
Chol/HDL Ratio: 5.1 ratio — ABNORMAL HIGH (ref 0.0–4.4)
Cholesterol, Total: 231 mg/dL — ABNORMAL HIGH (ref 100–199)
HDL: 45 mg/dL (ref >39)
LDL Chol Calc (NIH): 150 mg/dL — ABNORMAL HIGH (ref 0–99)
Triglycerides: 200 mg/dL — ABNORMAL HIGH (ref 0–149)
VLDL Cholesterol Cal: 36 mg/dL (ref 5–40)

## 2023-10-06 MED ORDER — ATORVASTATIN CALCIUM 20 MG PO TABS: 20.0000 mg | ORAL_TABLET | Freq: Every day | ORAL | 0 refills | Status: AC

## 2023-12-11 ENCOUNTER — Other Ambulatory Visit

## 2023-12-12 ENCOUNTER — Other Ambulatory Visit: Payer: Self-pay

## 2023-12-12 DIAGNOSIS — E785 Hyperlipidemia, unspecified: Secondary | ICD-10-CM

## 2023-12-13 ENCOUNTER — Ambulatory Visit: Payer: Self-pay | Admitting: Family

## 2023-12-13 DIAGNOSIS — E785 Hyperlipidemia, unspecified: Secondary | ICD-10-CM

## 2023-12-13 LAB — LIPID PANEL
Chol/HDL Ratio: 5 ratio — ABNORMAL HIGH (ref 0.0–4.4)
Cholesterol, Total: 252 mg/dL — ABNORMAL HIGH (ref 100–199)
HDL: 50 mg/dL (ref >39)
LDL Chol Calc (NIH): 183 mg/dL — ABNORMAL HIGH (ref 0–99)
Triglycerides: 109 mg/dL (ref 0–149)
VLDL Cholesterol Cal: 19 mg/dL (ref 5–40)

## 2023-12-13 MED ORDER — ATORVASTATIN CALCIUM 40 MG PO TABS: 40.0000 mg | ORAL_TABLET | Freq: Every day | ORAL | 0 refills | Status: DC

## 2024-03-20 ENCOUNTER — Other Ambulatory Visit: Payer: Self-pay | Admitting: Family

## 2024-03-20 DIAGNOSIS — E785 Hyperlipidemia, unspecified: Secondary | ICD-10-CM

## 2024-03-20 NOTE — Telephone Encounter (Signed)
 Complete
